# Patient Record
Sex: Female | Born: 1975 | Race: White | Hispanic: No | Marital: Single | Smoking: Current every day smoker
Health system: Southern US, Community
[De-identification: ages and names within clinical notes are randomized; demographics above are authoritative.]

## PROBLEM LIST (undated history)

## (undated) DIAGNOSIS — Z982 Presence of cerebrospinal fluid drainage device: Secondary | ICD-10-CM

## (undated) DIAGNOSIS — G039 Meningitis, unspecified: Secondary | ICD-10-CM

## (undated) HISTORY — PX: CHOLECYSTECTOMY: SHX55

---

## 2000-03-30 ENCOUNTER — Ambulatory Visit (HOSPITAL_COMMUNITY): Admission: RE | Admit: 2000-03-30 | Discharge: 2000-03-30 | Payer: Self-pay | Admitting: Obstetrics and Gynecology

## 2000-03-30 ENCOUNTER — Encounter: Payer: Self-pay | Admitting: Obstetrics and Gynecology

## 2000-06-10 ENCOUNTER — Ambulatory Visit (HOSPITAL_COMMUNITY): Admission: RE | Admit: 2000-06-10 | Discharge: 2000-06-10 | Payer: Self-pay | Admitting: Obstetrics and Gynecology

## 2000-07-11 ENCOUNTER — Encounter: Payer: Self-pay | Admitting: Obstetrics and Gynecology

## 2000-07-11 ENCOUNTER — Ambulatory Visit (HOSPITAL_COMMUNITY): Admission: RE | Admit: 2000-07-11 | Discharge: 2000-07-11 | Payer: Self-pay | Admitting: Obstetrics and Gynecology

## 2000-07-13 ENCOUNTER — Inpatient Hospital Stay (HOSPITAL_COMMUNITY): Admission: AD | Admit: 2000-07-13 | Discharge: 2000-07-13 | Payer: Self-pay | Admitting: Obstetrics and Gynecology

## 2000-07-15 ENCOUNTER — Encounter (HOSPITAL_COMMUNITY): Admission: RE | Admit: 2000-07-15 | Discharge: 2000-08-14 | Payer: Self-pay | Admitting: Obstetrics & Gynecology

## 2000-08-29 ENCOUNTER — Encounter (HOSPITAL_COMMUNITY): Admission: RE | Admit: 2000-08-29 | Discharge: 2000-09-01 | Payer: Self-pay | Admitting: Obstetrics and Gynecology

## 2000-08-31 ENCOUNTER — Inpatient Hospital Stay (HOSPITAL_COMMUNITY): Admission: AD | Admit: 2000-08-31 | Discharge: 2000-09-03 | Payer: Self-pay | Admitting: Obstetrics and Gynecology

## 2000-10-12 ENCOUNTER — Other Ambulatory Visit: Admission: RE | Admit: 2000-10-12 | Discharge: 2000-10-12 | Payer: Self-pay | Admitting: Obstetrics and Gynecology

## 2000-11-23 ENCOUNTER — Encounter (INDEPENDENT_AMBULATORY_CARE_PROVIDER_SITE_OTHER): Payer: Self-pay | Admitting: *Deleted

## 2000-11-24 ENCOUNTER — Encounter: Payer: Self-pay | Admitting: Emergency Medicine

## 2000-11-24 ENCOUNTER — Encounter: Payer: Self-pay | Admitting: General Surgery

## 2000-11-24 ENCOUNTER — Inpatient Hospital Stay (HOSPITAL_COMMUNITY): Admission: EM | Admit: 2000-11-24 | Discharge: 2000-11-27 | Payer: Self-pay | Admitting: Emergency Medicine

## 2001-09-04 ENCOUNTER — Encounter: Payer: Self-pay | Admitting: Emergency Medicine

## 2001-09-05 ENCOUNTER — Inpatient Hospital Stay (HOSPITAL_COMMUNITY): Admission: EM | Admit: 2001-09-05 | Discharge: 2001-09-09 | Payer: Self-pay | Admitting: Emergency Medicine

## 2001-09-06 ENCOUNTER — Encounter: Payer: Self-pay | Admitting: Internal Medicine

## 2001-09-09 ENCOUNTER — Encounter: Payer: Self-pay | Admitting: Infectious Diseases

## 2002-02-14 ENCOUNTER — Emergency Department (HOSPITAL_COMMUNITY): Admission: EM | Admit: 2002-02-14 | Discharge: 2002-02-14 | Payer: Self-pay | Admitting: Emergency Medicine

## 2002-02-15 ENCOUNTER — Encounter: Payer: Self-pay | Admitting: Emergency Medicine

## 2002-03-17 ENCOUNTER — Emergency Department (HOSPITAL_COMMUNITY): Admission: EM | Admit: 2002-03-17 | Discharge: 2002-03-17 | Payer: Self-pay | Admitting: Emergency Medicine

## 2002-03-20 ENCOUNTER — Other Ambulatory Visit: Admission: RE | Admit: 2002-03-20 | Discharge: 2002-03-20 | Payer: Self-pay | Admitting: Obstetrics and Gynecology

## 2003-02-16 ENCOUNTER — Emergency Department (HOSPITAL_COMMUNITY): Admission: EM | Admit: 2003-02-16 | Discharge: 2003-02-16 | Payer: Self-pay | Admitting: Emergency Medicine

## 2003-03-20 ENCOUNTER — Emergency Department (HOSPITAL_COMMUNITY): Admission: EM | Admit: 2003-03-20 | Discharge: 2003-03-20 | Payer: Self-pay | Admitting: Emergency Medicine

## 2003-03-25 ENCOUNTER — Emergency Department (HOSPITAL_COMMUNITY): Admission: EM | Admit: 2003-03-25 | Discharge: 2003-03-25 | Payer: Self-pay | Admitting: Emergency Medicine

## 2003-04-02 ENCOUNTER — Emergency Department (HOSPITAL_COMMUNITY): Admission: EM | Admit: 2003-04-02 | Discharge: 2003-04-02 | Payer: Self-pay | Admitting: Emergency Medicine

## 2003-06-08 ENCOUNTER — Emergency Department (HOSPITAL_COMMUNITY): Admission: EM | Admit: 2003-06-08 | Discharge: 2003-06-08 | Payer: Self-pay | Admitting: Emergency Medicine

## 2004-05-05 ENCOUNTER — Other Ambulatory Visit: Admission: RE | Admit: 2004-05-05 | Discharge: 2004-05-05 | Payer: Self-pay | Admitting: Obstetrics and Gynecology

## 2004-08-01 ENCOUNTER — Emergency Department (HOSPITAL_COMMUNITY): Admission: EM | Admit: 2004-08-01 | Discharge: 2004-08-01 | Payer: Self-pay | Admitting: Emergency Medicine

## 2004-08-15 ENCOUNTER — Emergency Department (HOSPITAL_COMMUNITY): Admission: EM | Admit: 2004-08-15 | Discharge: 2004-08-15 | Payer: Self-pay | Admitting: *Deleted

## 2004-08-22 ENCOUNTER — Emergency Department (HOSPITAL_COMMUNITY): Admission: EM | Admit: 2004-08-22 | Discharge: 2004-08-22 | Payer: Self-pay | Admitting: Emergency Medicine

## 2004-08-27 ENCOUNTER — Inpatient Hospital Stay (HOSPITAL_COMMUNITY): Admission: EM | Admit: 2004-08-27 | Discharge: 2004-08-31 | Payer: Self-pay | Admitting: Emergency Medicine

## 2004-08-30 ENCOUNTER — Encounter: Payer: Self-pay | Admitting: *Deleted

## 2004-09-06 ENCOUNTER — Emergency Department (HOSPITAL_COMMUNITY): Admission: EM | Admit: 2004-09-06 | Discharge: 2004-09-06 | Payer: Self-pay | Admitting: Emergency Medicine

## 2004-09-12 ENCOUNTER — Encounter: Admission: RE | Admit: 2004-09-12 | Discharge: 2004-09-12 | Payer: Self-pay | Admitting: Internal Medicine

## 2004-10-12 ENCOUNTER — Emergency Department (HOSPITAL_COMMUNITY): Admission: EM | Admit: 2004-10-12 | Discharge: 2004-10-12 | Payer: Self-pay | Admitting: Emergency Medicine

## 2004-11-03 ENCOUNTER — Emergency Department (HOSPITAL_COMMUNITY): Admission: EM | Admit: 2004-11-03 | Discharge: 2004-11-03 | Payer: Self-pay | Admitting: Emergency Medicine

## 2004-11-06 ENCOUNTER — Emergency Department (HOSPITAL_COMMUNITY): Admission: EM | Admit: 2004-11-06 | Discharge: 2004-11-06 | Payer: Self-pay | Admitting: Emergency Medicine

## 2004-11-15 ENCOUNTER — Emergency Department (HOSPITAL_COMMUNITY): Admission: EM | Admit: 2004-11-15 | Discharge: 2004-11-15 | Payer: Self-pay | Admitting: *Deleted

## 2004-11-19 ENCOUNTER — Emergency Department (HOSPITAL_COMMUNITY): Admission: EM | Admit: 2004-11-19 | Discharge: 2004-11-19 | Payer: Self-pay | Admitting: Emergency Medicine

## 2004-11-22 ENCOUNTER — Emergency Department (HOSPITAL_COMMUNITY): Admission: EM | Admit: 2004-11-22 | Discharge: 2004-11-22 | Payer: Self-pay | Admitting: Emergency Medicine

## 2004-11-27 ENCOUNTER — Emergency Department (HOSPITAL_COMMUNITY): Admission: EM | Admit: 2004-11-27 | Discharge: 2004-11-27 | Payer: Self-pay | Admitting: Emergency Medicine

## 2005-01-10 ENCOUNTER — Emergency Department (HOSPITAL_COMMUNITY): Admission: EM | Admit: 2005-01-10 | Discharge: 2005-01-10 | Payer: Self-pay | Admitting: Emergency Medicine

## 2005-01-18 ENCOUNTER — Ambulatory Visit: Payer: Self-pay | Admitting: Internal Medicine

## 2005-02-21 ENCOUNTER — Emergency Department (HOSPITAL_COMMUNITY): Admission: EM | Admit: 2005-02-21 | Discharge: 2005-02-21 | Payer: Self-pay | Admitting: Emergency Medicine

## 2005-03-11 ENCOUNTER — Ambulatory Visit: Payer: Self-pay | Admitting: Family Medicine

## 2005-03-17 ENCOUNTER — Emergency Department (HOSPITAL_COMMUNITY): Admission: EM | Admit: 2005-03-17 | Discharge: 2005-03-17 | Payer: Self-pay | Admitting: Emergency Medicine

## 2005-04-14 ENCOUNTER — Emergency Department (HOSPITAL_COMMUNITY): Admission: EM | Admit: 2005-04-14 | Discharge: 2005-04-14 | Payer: Self-pay | Admitting: Emergency Medicine

## 2005-05-15 ENCOUNTER — Emergency Department (HOSPITAL_COMMUNITY): Admission: EM | Admit: 2005-05-15 | Discharge: 2005-05-15 | Payer: Self-pay | Admitting: *Deleted

## 2005-05-29 ENCOUNTER — Emergency Department (HOSPITAL_COMMUNITY): Admission: EM | Admit: 2005-05-29 | Discharge: 2005-05-29 | Payer: Self-pay | Admitting: Emergency Medicine

## 2005-06-14 ENCOUNTER — Emergency Department (HOSPITAL_COMMUNITY): Admission: EM | Admit: 2005-06-14 | Discharge: 2005-06-14 | Payer: Self-pay | Admitting: Emergency Medicine

## 2005-06-28 ENCOUNTER — Other Ambulatory Visit: Admission: RE | Admit: 2005-06-28 | Discharge: 2005-06-28 | Payer: Self-pay | Admitting: Obstetrics and Gynecology

## 2005-07-06 ENCOUNTER — Emergency Department (HOSPITAL_COMMUNITY): Admission: EM | Admit: 2005-07-06 | Discharge: 2005-07-06 | Payer: Self-pay | Admitting: Emergency Medicine

## 2005-07-15 ENCOUNTER — Emergency Department (HOSPITAL_COMMUNITY): Admission: EM | Admit: 2005-07-15 | Discharge: 2005-07-15 | Payer: Self-pay | Admitting: Emergency Medicine

## 2005-07-29 ENCOUNTER — Emergency Department (HOSPITAL_COMMUNITY): Admission: EM | Admit: 2005-07-29 | Discharge: 2005-07-29 | Payer: Self-pay | Admitting: Emergency Medicine

## 2005-09-22 ENCOUNTER — Emergency Department (HOSPITAL_COMMUNITY): Admission: EM | Admit: 2005-09-22 | Discharge: 2005-09-22 | Payer: Self-pay | Admitting: Emergency Medicine

## 2005-11-11 ENCOUNTER — Emergency Department (HOSPITAL_COMMUNITY): Admission: EM | Admit: 2005-11-11 | Discharge: 2005-11-11 | Payer: Self-pay | Admitting: Emergency Medicine

## 2006-04-26 ENCOUNTER — Emergency Department (HOSPITAL_COMMUNITY): Admission: EM | Admit: 2006-04-26 | Discharge: 2006-04-26 | Payer: Self-pay | Admitting: Emergency Medicine

## 2006-05-01 ENCOUNTER — Emergency Department (HOSPITAL_COMMUNITY): Admission: EM | Admit: 2006-05-01 | Discharge: 2006-05-01 | Payer: Self-pay | Admitting: Emergency Medicine

## 2006-06-28 ENCOUNTER — Emergency Department (HOSPITAL_COMMUNITY): Admission: EM | Admit: 2006-06-28 | Discharge: 2006-06-28 | Payer: Self-pay | Admitting: Emergency Medicine

## 2006-07-15 ENCOUNTER — Emergency Department (HOSPITAL_COMMUNITY): Admission: EM | Admit: 2006-07-15 | Discharge: 2006-07-15 | Payer: Self-pay | Admitting: Emergency Medicine

## 2006-07-17 ENCOUNTER — Emergency Department (HOSPITAL_COMMUNITY): Admission: EM | Admit: 2006-07-17 | Discharge: 2006-07-17 | Payer: Self-pay | Admitting: Emergency Medicine

## 2006-08-03 ENCOUNTER — Ambulatory Visit: Payer: Self-pay | Admitting: Internal Medicine

## 2006-08-16 ENCOUNTER — Ambulatory Visit (HOSPITAL_COMMUNITY): Admission: RE | Admit: 2006-08-16 | Discharge: 2006-08-16 | Payer: Self-pay | Admitting: Internal Medicine

## 2007-05-01 ENCOUNTER — Emergency Department (HOSPITAL_COMMUNITY): Admission: EM | Admit: 2007-05-01 | Discharge: 2007-05-01 | Payer: Self-pay | Admitting: Emergency Medicine

## 2007-09-15 ENCOUNTER — Emergency Department (HOSPITAL_COMMUNITY): Admission: EM | Admit: 2007-09-15 | Discharge: 2007-09-15 | Payer: Self-pay | Admitting: Emergency Medicine

## 2007-09-27 ENCOUNTER — Emergency Department (HOSPITAL_COMMUNITY): Admission: EM | Admit: 2007-09-27 | Discharge: 2007-09-27 | Payer: Self-pay | Admitting: Emergency Medicine

## 2007-11-12 ENCOUNTER — Emergency Department (HOSPITAL_COMMUNITY): Admission: EM | Admit: 2007-11-12 | Discharge: 2007-11-12 | Payer: Self-pay | Admitting: Emergency Medicine

## 2007-12-31 ENCOUNTER — Emergency Department (HOSPITAL_COMMUNITY): Admission: EM | Admit: 2007-12-31 | Discharge: 2007-12-31 | Payer: Self-pay | Admitting: Emergency Medicine

## 2008-07-08 ENCOUNTER — Emergency Department (HOSPITAL_COMMUNITY): Admission: EM | Admit: 2008-07-08 | Discharge: 2008-07-08 | Payer: Self-pay | Admitting: Family Medicine

## 2008-10-09 ENCOUNTER — Emergency Department (HOSPITAL_COMMUNITY): Admission: EM | Admit: 2008-10-09 | Discharge: 2008-10-09 | Payer: Self-pay | Admitting: Emergency Medicine

## 2009-05-21 ENCOUNTER — Emergency Department (HOSPITAL_COMMUNITY): Admission: EM | Admit: 2009-05-21 | Discharge: 2009-05-21 | Payer: Self-pay | Admitting: Emergency Medicine

## 2009-05-24 ENCOUNTER — Emergency Department (HOSPITAL_COMMUNITY): Admission: EM | Admit: 2009-05-24 | Discharge: 2009-05-24 | Payer: Self-pay | Admitting: Emergency Medicine

## 2009-05-28 ENCOUNTER — Emergency Department (HOSPITAL_COMMUNITY): Admission: EM | Admit: 2009-05-28 | Discharge: 2009-05-29 | Payer: Self-pay | Admitting: Emergency Medicine

## 2010-02-27 ENCOUNTER — Emergency Department (HOSPITAL_COMMUNITY)
Admission: EM | Admit: 2010-02-27 | Discharge: 2010-02-27 | Payer: Self-pay | Source: Home / Self Care | Admitting: Emergency Medicine

## 2010-03-31 ENCOUNTER — Emergency Department (HOSPITAL_COMMUNITY)
Admission: EM | Admit: 2010-03-31 | Discharge: 2010-03-31 | Payer: Self-pay | Source: Home / Self Care | Admitting: Emergency Medicine

## 2010-04-02 ENCOUNTER — Emergency Department (HOSPITAL_COMMUNITY)
Admission: EM | Admit: 2010-04-02 | Discharge: 2010-04-02 | Payer: Self-pay | Source: Home / Self Care | Admitting: Family Medicine

## 2010-05-18 LAB — POCT I-STAT, CHEM 8
BUN: 8 mg/dL (ref 6–23)
Chloride: 108 mEq/L (ref 96–112)
HCT: 44 % (ref 36.0–46.0)
Hemoglobin: 15 g/dL (ref 12.0–15.0)
Potassium: 3.9 mEq/L (ref 3.5–5.1)

## 2010-05-18 LAB — D-DIMER, QUANTITATIVE: D-Dimer, Quant: <0.22 ug/mL-FEU (ref 0.00–0.48)

## 2010-05-29 LAB — COMPREHENSIVE METABOLIC PANEL
AST: 13 U/L (ref 0–37)
Albumin: 3.3 g/dL — ABNORMAL LOW (ref 3.5–5.2)
Alkaline Phosphatase: 55 U/L (ref 39–117)
Chloride: 108 mEq/L (ref 96–112)
Creatinine, Ser: 0.73 mg/dL (ref 0.4–1.2)
GFR calc Af Amer: >60 mL/min (ref >60)
Potassium: 3.6 mEq/L (ref 3.5–5.1)
Total Bilirubin: 0.3 mg/dL (ref 0.3–1.2)

## 2010-05-29 LAB — GC/CHLAMYDIA PROBE AMP, GENITAL
Chlamydia, DNA Probe: NEGATIVE
GC Probe Amp, Genital: NEGATIVE

## 2010-05-29 LAB — URINALYSIS, ROUTINE W REFLEX MICROSCOPIC
Bilirubin Urine: NEGATIVE
Bilirubin Urine: NEGATIVE
Ketones, ur: NEGATIVE mg/dL
Ketones, ur: NEGATIVE mg/dL
Nitrite: NEGATIVE
Nitrite: NEGATIVE
Specific Gravity, Urine: 1.025 (ref 1.005–1.030)
Urobilinogen, UA: 0.2 mg/dL (ref 0.0–1.0)
Urobilinogen, UA: 0.2 mg/dL (ref 0.0–1.0)

## 2010-05-29 LAB — URINE MICROSCOPIC-ADD ON

## 2010-05-29 LAB — DIFFERENTIAL
Basophils Relative: 0 % (ref 0–1)
Eosinophils Absolute: 0.2 10*3/uL (ref 0.0–0.7)
Lymphocytes Relative: 22 % (ref 12–46)

## 2010-05-29 LAB — CBC
Platelets: 198 10*3/uL (ref 150–400)
WBC: 11.3 10*3/uL — ABNORMAL HIGH (ref 4.0–10.5)

## 2010-05-29 LAB — POCT I-STAT, CHEM 8
Calcium, Ion: 1.14 mmol/L (ref 1.12–1.32)
Chloride: 106 mEq/L (ref 96–112)
HCT: 40 % (ref 36.0–46.0)
TCO2: 25 mmol/L (ref 0–100)

## 2010-05-29 LAB — URINE CULTURE
Colony Count: >=100000
Culture: NO GROWTH

## 2010-05-29 LAB — POCT PREGNANCY, URINE: Preg Test, Ur: NEGATIVE

## 2010-05-31 LAB — CBC
HCT: 39.8 % (ref 36.0–46.0)
MCV: 97.8 fL (ref 78.0–100.0)
Platelets: 228 10*3/uL (ref 150–400)
RDW: 12.9 % (ref 11.5–15.5)

## 2010-05-31 LAB — WET PREP, GENITAL
Trich, Wet Prep: NONE SEEN
Yeast Wet Prep HPF POC: NONE SEEN

## 2010-05-31 LAB — DIFFERENTIAL
Basophils Absolute: 0 10*3/uL (ref 0.0–0.1)
Eosinophils Absolute: 0.2 10*3/uL (ref 0.0–0.7)
Eosinophils Relative: 2 % (ref 0–5)
Neutrophils Relative %: 68 % (ref 43–77)

## 2010-05-31 LAB — LIPASE, BLOOD: Lipase: 28 U/L (ref 11–59)

## 2010-07-24 NOTE — H&P (Signed)
[place]. [facility]  Patient:    Leah Grimes, MOYD Visit Number: [id number] MRN: [id number]          Service Type: SUR Location: [id number] Attending Physician:  Brandy Hale Dictated by:   Angelia Mould. Derrell Lolling, M.D. Admit Date:  11/23/2000   CC:         Hillary Bow, M.D.   History and Physical  CHIEF COMPLAINT:  Abdominal pain.  HISTORY OF PRESENT ILLNESS:  This is a 35 year old white female who developed upper abdominal pain radiating to her back at 8:30 last night after supper. This became severe, but was not associated with nausea, vomiting, fever, chills, or diarrhea.  She came to the emergency room, and has undergone extensive evaluation.  The pain has gotten much better over the past two hours after she received a narcotic analgesic.  Workup shows gallstones, and I was called to see her for evaluation.  PAST MEDICAL HISTORY: 1. She has had one pregnancy and one delivery, and is now 3 months postpartum. 2. She had some type of "tags" removed from around her rectum in 1997. 3. Wisdom teeth have been removed.  CURRENT MEDICATIONS:  Birth control patch, and changes this weekly.  ALLERGIES:  No known drug allergies.  FAMILY HISTORY:  Mother and father living and well.  She has two sisters, one of which has asthma.  No familial diseases, otherwise noncontributory.  SOCIAL HISTORY:  The patient has one child.  She lives with her boyfriend here in [place].  She works as the International aid/development worker for [employer]  She smokes 1/2 pack of cigarettes per day.  Alcohol intake is minimal, drinking one drink per week.  REVIEW OF SYSTEMS:  All systems are reviewed and are noncontributory except as described above.  PHYSICAL EXAMINATION:  GENERAL:  A pleasant, somewhat overweight, young white female in minimal distress.  VITAL SIGNS:  Temperature 97.7, initial pulse was 125, respiratory rate 22, blood pressure 145/98.  HEENT:   Sclerae clear.  Extraocular movements intact.  Oropharynx clear without lesions.  NECK:  Supple, nontender, no mass, no adenopathy, no thyromegaly, no bruit.  LUNGS:  Clear to auscultation.  HEART:  Regular rate and rhythm, no murmur.  No CVA tenderness.  ABDOMEN:  Somewhat obese, soft, minimal subjective right upper quadrant tenderness.  No guarding, no mass.  Bowel sounds are present, hypoactive. There are no hernias noted.  She has stretch marks throughout the abdomen.  EXTREMITIES:  No edema, good pulses.  NEUROLOGIC:  Grossly within normal limits.  LABORATORY DATA:  Ultrasound shows multiple gallstones, some thickening of the gallbladder wall.  There is no free fluid, and the common bile duct measures 4.0 mm.  Hemoglobin 13.0, white blood cell count 15,200, lipase 37, amylase 65.  SGOT 74, SGPT 67, other liver function tests are normal.  Urinalysis is pending.  IMPRESSION: 1. Acute cholecystitis with cholelithiasis. 2. Three months postpartum.  PLAN:  The patient will be admitted to the hospital and started on IV antibiotics, and given analgesics as necessary.  We will plan to proceed with a laparoscopic cholecystectomy with cholangiogram today.  I have discussed the indications and details of the surgery with the patient and her mother.  Risks and complications have been outlined, including but not limited to, conversion to open laparotomy, bleeding, infection, bile duct injury, injury to adjacent organs, wound problems such as infection and hernia, and other unforeseen problems.  They seem to understand these issues well.  At  this time, all of their questions are answered.  She agrees to this plan. Dictated by:   Angelia Mould. Derrell Lolling, M.D. Attending Physician:  Brandy Hale DD:  11/24/00 TD:  11/24/00 Job: [id number]

## 2010-07-24 NOTE — Op Note (Signed)
[place]. [facility]  Patient:    Leah Grimes, Leah Grimes Visit Number: [id number] MRN: [id number]          Service Type: SUR Location: 5700 [id number] Attending Physician:  Brandy Hale Dictated by:   Angelia Mould. Derrell Lolling, M.D. Proc. Date: 11/24/00 Admit Date:  11/23/2000   CC:         [name], M.D.   Operative Report  PREOPERATIVE DIAGNOSIS:  Chronic and acute cholecystitis with cholelithiasis.  POSTOPERATIVE DIAGNOSIS:  Acute and chronic cholecystitis with cholelithiasis, choledocholithiasis.  OPERATION PERFORMED:  Laparoscopic cholecystectomy with intraoperative cholangiogram.  SURGEON:  Angelia Mould. Derrell Lolling, M.D.  ASSISTANT:  Jimmye Norman, M.D.  ANESTHESIA:  INDICATIONS FOR PROCEDURE:  The patient is a 35 year old white female who presented to the . [facility] Emergency Department with a 12-hour history of upper abdominal pain, back pain, but no vomiting.  She has not had any prior episodes.  On exam she was found to have fairly significant tenderness in the upper abdomen which improved after several hours. Ultrasound showed a thickened gallbladder and multiple gallstones.  The common bile duct was only 4.0 mm in diameter.  Blood work revealed an SGOT to be 74, SGPT 67, bilirubin normal, white blood cell count 15,000, amylase 65.  She was brought to the operating room this morning for cholecystectomy.  OPERATIVE FINDINGS:  The gallbladder was minimally inflamed, had a little bit of edema around the infundibulum but otherwise did not look obviously infected.  The gallbladder and the cystic duct contained numerous stones.  The cholangiogram showed normal intrahepatic and extrahepatic bile duct anatomy, but there appeared to be a filling defect in the distal common bile duct and on two injections, the contrast did not drain into the duodenum.  The liver appeared healthy.  The stomach, duodenum, small intestine, and large  intestine were grossly normal to inspection.  There was no ascites.  The peritoneal surfaces were normal.  DESCRIPTION OF PROCEDURE:  Following the induction of general endotracheal anesthesia, the patients abdomen was prepped and draped in sterile fashion. 0.25% Marcaine with epinephrine was used as a local infiltration anesthetic. A vertically oriented incision was made at the lower rim of the umbilicus. The fascia was incised in the midline and the abdominal cavity entered under direct vision.  A 10 mm Hasson trocar was inserted and secured with a pursestring suture of 0 Vicryl.  Pneumoperitoneum was created.  A video camera was inserted with visualization and with the findings as described above.  A 10 mm trocar was placed in the subxiphoid region and two 5 mm trocars placed in the right midabdomen.  The gallbladder was elevated and placed on traction. I noted that the patient had a very large hepatic artery which was tortuous and swung around anterior to the common bile duct before going back to the left and back up into the liver.  This was easily avoided, however.  We dissected out the cystic duct and cystic artery very carefully.  I got a significantly good length of the cystic duct.  I saw stones in the cystic duct and milked them back into the gallbladder.  A clip was placed on the cystic duct close to the gallbladder.  The cystic duct was opened and a cholangiogram catheter inserted after milking a couple of stones out of it.  Cholangiogram was obtained using the C-arm.  The cholangiogram showed a filling defect in the distal common bile duct and no flow into the  duodenum.  Otherwise the anatomy was normal.  We felt that ERCP would be the best approach for managing her common bile duct stones.  The cholangiogram catheter was removed.  The cystic duct was secured with multiple metal clips and divided.  The cystic artery was isolated and controlled with metal clips.  The  gallbladder was then dissected from its bed with electrocautery and removed through the umbilical port.  The operative field was copiously irrigated.  At the completion of the case there was no bleeding and no bile leak whatsoever.  The irrigation fluid was completely clear.  The trocars were removed under direct vision and there was no bleeding from the trocar sites.  The pneumoperitoneum was released.  The fascia at the umbilicus was closed with 0 Vicryl sutures.  The skin incisions were closed with subcuticular sutures of 4-0 Vicryl and Steri-Strips.  Clean bandages were placed.  The patient was taken to the recovery room in stable condition. Estimated blood loss was about 15 cc.  Complications were none.  Sponge, needle and instrument counts were correct.  I contacted Dr. [name] to evaluate the patient for possible endoscopic retrograde cholangiopancreatography during her hospital course. Dictated by:   Angelia Mould. Derrell Lolling, M.D. Attending Physician:  Brandy Hale DD:  11/24/00 TD:  11/24/00 Job: [id number]

## 2010-07-24 NOTE — H&P (Signed)
[facility]  Patient:    Leah Grimes, Leah Grimes Visit Number: [id number] MRN: [id number]          Service Type: MED Location: [phone] Attending Physician:  Jerl Santos Dictated by:   Renford Dills, M.D. Admit Date:  09/04/2001                           History and Physical  PROBLEM LIST: 1. Headache, neck pain.  Rule out meningitis. 2. Recent ear infection, bilateral, 1 week ago.  Treated with Trimox and    Cipro otic. 3. Status post cholecystectomy in September 2002.  CHIEF COMPLAINT:  Headache.  HISTORY OF PRESENT ILLNESS:  A 35 year old healthy female with above medical problems was in her usual of health until yesterday approximately 3 p.m. and had the onset of headache at the base of the neck, worse with movement, cough, unrelieved with medications at home i.e. NSAIDs.  The patient denies any similar history of headaches and of note has had recent ear infection approximately 1 week ago treated with antibiotics.  On the day of the headache the patient stated that the headache persisted all day despite taking Motrin at home and went to sleep with a headache and awoke with a headache.  She came to the ED for evaluation because of persistent headache, associated with some nausea and vomiting.  Denied any blood. Also had some chills and sweats but no documented fever at home.  Of note also recalls having sensitivity to the light.  The patient also has had a sick contact with a small child, her daughter, who has had an ear infection.  In the ED the patient was evaluated, had lipid panel performed at [facility], call for further evaluation.  PAST MEDICAL HISTORY:  Please see problem list.  MEDICATIONS:  The patient takes Ortho Tri-Cyclen birth control pills.  SOCIAL HISTORY:  Positive for tobacco of 5 to 6 cigarettes per day. Social alcohol.  No drugs.  Patient works at [employer].  PAST SURGICAL HISTORY:  Significant for  cholecystectomy in 2002.  ALLERGIES:  There are no known drug allergies.  REVIEW OF SYSTEMS:  As noted in history of present illness.  FAMILY HISTORY:  Mother and sister have asthma.  Father essentially healthy.  PHYSICAL EXAMINATION;  GENERAL:  The patient is alert and oriented x 3.  Moderate distress secondary to headache and neck pain.  VITAL SIGNS:  Temperature 98.1, blood pressure 116/79, pulse 102.  HEENT:  Pupils, equal, round, reactive to light and icteric.  Positive nuchal pain with movement.  Negative Babinski and negative Kernig.  There are no oral lesions.  Right tympanic membranes are occluded with wax on the left. Sternal canal is moist.  Tympanic bulge with serous fluid behind.  CHEST:  Clear to auscultation bilaterally.  CARDIOVASCULAR:  Regular S1, S2.  No S3.  ABDOMEN:  Positive bowel sounds.  Nontender.  No hepatosplenomegaly.  EXTREMITIES:  No clubbing, cyanosis, or edema.  SKIN:  No rash.  RECTAL:  Deferred.  NEUROLOGIC:  Cranial nerves II-XII intact.  There is a positive photophobia. 5/5 motor and no cerebellar deficit.  Gait intact.  LABORATORY DATA:  CBC:  White count 13.5, hemoglobin 13.7, platelet 242.  CSF 21.  Total protein of 51, glucose 51, white blood cell count 155, 30 red blood cells, 90% lymphs, 8% neutrophils.  Gram stain moderate WBC, no organisms, ______, 177 white blood cells, and 10  red blood cells, 87% lymphs. Urinalysis is negative.  BMET essentially normal.  ______ to within normal range.  CT of the head negative.  ASSESSMENT AND PLAN: 1. Rule out meningitis in a patient with headache, photophobia and abnormal    CSF showing elevated protein and elevated WBC predominantly less.  This    most likely represents a viral meningitis but will treat empirically for    bacterial until cultures are obtained.  Start antibiotics in the form of    vancomycin and Rocephin.  Will pan culture the patient.  Will obtain ID    consult if course  is atypical.  Also check CSF for HSV via PCR. 2. Tobacco use.  Recommend stop. 3. Partially treated ear infection. The patient should be adequately covered    with the above antibiotics.  Will continue Cipro otic a.c.  I will make further recommendations after review of the above studies.  Thank you in advance. Dictated by:   Renford Dills, M.D. Attending Physician:  Jerl Santos DD:  09/05/01 TD:  09/06/01 Job: [id number]

## 2010-07-24 NOTE — Discharge Summary (Signed)
NAME:  Leah Grimes, Leah Grimes                      ACCOUNT NO.:  [id number]   MEDICAL RECORD NO.:  [id number]                   PATIENT TYPE:   LOCATION:                                       FACILITY:   PHYSICIAN:  Lilla Shook, M.D.            DATE OF BIRTH:   DATE OF ADMISSION:  09/05/2001  DATE OF DISCHARGE:  09/09/2001                                 DISCHARGE SUMMARY   DISCHARGE DIAGNOSES:  1. 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  2. Tobacco abuse.  3. Status post cholecystectomy September 2002.   CHIEF COMPLAINT:  Headache.   HISTORY OF PRESENT ILLNESS:  This is a 35 year old woman who, overall, was  healthy but was evaluated for the onset of a headache at the base of her  neck which was worse with movement or cough and unrelieved with medications  at home.  She was taking medications for the headache which persisted  despite these.  She has had some associated nausea and vomiting but with no  fevers or chills.  She did have some sensitivity to light.  She has a young  child who had an ear infection prior to that, as did she, and was seen for  this about a week prior.  She was on Trimox and Cipro ear drops.   PHYSICAL EXAMINATION:  Significant findings on admission: She was afebrile  with blood pressure 160/79.  There was positive nuchal pain with movement  but negative Babinski and Kernig signs.  Her tympanic membrane was occluded  with wax.  Tympanic bulge and serous fluid were noted.   LABORATORY DATA:  White blood cell count 13.5, hemoglobin 13.7.  CSF showed  a total protein 51, 90% lymphocytes.  Gram's stain was moderate white blood  cells, but no organisms.  Urinalysis was negative.  BMET: Essentially  normal.  CT of the head was negative.   HOSPITAL COURSE:  The patient was admitted with a presumed diagnosis of  meningitis of unknown cause.  She was placed on empiric antibiotics, that  being vancomycin and Rocephin.  Cultures were obtained.  Also, the  next day  and added, HSV and PPR to her CSF fluid studies.  She had pain control  initiated with Percocet and Phenergan for her nausea.  She was started on a  nicotine patch but there was a lot of difficulty with her leaving the floor  to go and smoke.  She did not improve after two days of antibiotics and was  sent for repeat lumbar puncture, which was again notable for lymphocytosis,  but also had atypical lymphs.  Her HSV PPR came back positive for herpes  simplex and she was started on acyclovir.  An infectious disease consult had  been obtained prior to that secondary to her persistent mild symptoms  despite antibiotic therapy.   CONDITION ON DISCHARGE:  Discharge condition: Stable.   DISCHARGE MEDICATIONS:  1. Valacyclovir 1 g b.i.d. for a  total of seven days.  2. Darvocet-N 100 p.r.n. headache, #21.  3. Cipro ear drops to each ear twice a day with the last dose being 7/8 at     10 p.m.    DISCHARGE INSTRUCTIONS:  She was to remain at home for approximately a week  and then resume her normal activities when feeling better.  She was to call  the office for an appointment in one week if need.  She was to return to the  emergency room if her headaches returned.                                                Lilla Shook, M.D.    SEJ/MEDQ  D:  11/15/2001  T:  11/15/2001  Job:  [id number] [id number]

## 2010-07-24 NOTE — Discharge Summary (Signed)
[facility]  Patient:    Leah Grimes, Leah Grimes                    MRN: [id number] Adm. Date:  [id number] Disc. Date: [id number] Attending:  Oliver Pila                           Discharge Summary  HISTORY OF PRESENT ILLNESS:   This a 35 year old, white, single female, para 0-0-2-0, gravida 3.  Last period November 22, 1999.  [facility] August 28, 2000, by dates and August 29, 2000, by ultrasound.  Admitted with rupture of membranes.  Blood group and type A- with a negative antibody.  VDRL negative. Rubella immune.  Hepatitis B surface antigen negative.  HIV negative.  GC and chlamydia negative.  Triple screen normal.  One-hour glucola 110.  Group B streptococcus negative.  Vaginal ultrasound on January 26, 2000, with crown-rump length of 2.1 cm, 8 weeks 6 days, and [facility] August 29, 2000.  Repeat ultrasound on March 30, 2000, showed average gestational age [redacted] weeks 2 days and [facility] August 29, 2000.  The patient had an essentially uncomplicated prenatal course.  At approximately 3:15 a.m. on the day of admission, the patient began contracting.  ALLERGIES:                    No known allergies.  PAST MEDICAL HISTORY:         D&C in 1998.  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 Illnesses:  Gonorrhea in 1997.  SOCIAL HISTORY:               Alcohol, tobacco, and drugs negative, except five or six to ten cigarettes per day.  FAMILY HISTORY:               Maternal first cousin with leukemia.  OBSTETRICAL HISTORY:          Early abortions in 1998 x 2.  PHYSICAL EXAMINATION ON ADMISSION:                    Normal vital signs.  ABDOMEN:                      Soft.  Fundal height 38 cm on August 29, 2000.  PELVIC:                       The cervix was fingertip, 60-70% effaced, and vertex at a -1 by the maternity admission unit nurse.  Positive fern by the maternity admission unit nurse practitioner.  IMPRESSIONS:                  1. Intrauterine pregnancy at 40+ weeks.                           2. Premature rupture of membranes.  HOSPITAL COURSE:              The patient was admitted to the hospital.  She was begun on Pitocin.  By 1 p.m., she was getting uncomfortable.  By 5:30 p.m. and her cervix was 4-5 cm, 90%.  An intrauterine pressure catheter was placed by Zenaida Niece, M.D.  By 7:50 p.m., the cervix was 6 cm, completely effaced, and vertex at a 0 station.  By 10:40 p.m., the cervix was fully dilated and vertex at  a +2 station.  The patient pushed well.  She had a spontaneous vaginal delivery of a living female infant, 7 pounds 4 ounces, Apgars of 9 at one minute and 9 at five minutes by Zenaida Niece, M.D., over a second degree laceration.  The placenta was spontaneous and intact.  A second degree laceration was repaired with 3-0 Vicryl.  Right labial laceration repaired with 3-0 Vicryl for hemostasis.  Blood loss less than 500 cc.  Postpartum the patient did quite well and was discharged on the second postpartum day.  LABORATORY DATA:              Hemoglobin 12.4, hematocrit 35.7, white count 13,300, platelet count 230,000.  RPR nonreactive.  The baby was Rh-. The mother was not an Rh immune globulin candidate.  FINAL DIAGNOSES:              1. Intrauterine pregnancy at 40 weeks.                               2. Rupture of membranes.                               3. Spontaneous vaginal delivery.  OPERATIONS:                   1. Spontaneous delivery.                               2. Repair of second degree midline laceration.  FINAL CONDITION:              Improved.  DISCHARGE INSTRUCTIONS:       Our regular discharge instruction booklet.  The patient is uncertain whether her physician told her to return in two weeks or six weeks, so she is to call the office on September 05, 2000, for final instructions in that regard.  DISCHARGE MEDICATIONS:        She is given a prescription for Percocet 5/325 mg, #16, one every four to six hours as  needed for pain.  FOLLOW-UP:                    She will return as directed on September 05, 2000. DD:  09/03/00 TD:  09/03/00 Job: [id number]

## 2010-07-24 NOTE — H&P (Signed)
NAMEARYAN, Leah Grimes             ACCOUNT NO.:  [id number]   MEDICAL RECORD NO.:  [id number]          PATIENT TYPE:  INP   LOCATION:  0104                         FACILITY:  [facility]   PHYSICIAN:  Theone Stanley, MD   DATE OF BIRTH:  [birth year]   DATE OF ADMISSION:  08/27/2004  DATE OF DISCHARGE:                                HISTORY & PHYSICAL   HISTORY OF PRESENT ILLNESS:  This is a 35 year old female who is presenting  with complaints of back pain and headache.  The patient started to have back  pain on Tuesday night and subsequently started to have a very severe  headache, starting today.  She has had nausea, one episode of emesis, and  because of this and the fact that she had a previous history of meningitis,  she came into the hospital.  She had a history of viral meningitis three  years ago.  Patient does state she had a cold about one week ago.  She  denies any fevers or chills.   PAST MEDICAL HISTORY:  1.  IBS.  2.  Meningitis three years ago.   MEDICATIONS:  None.   ALLERGIES:  None.   FAMILY HISTORY:  Positive for cancer.  There has been leukemia, ovarian, in  the family.   SOCIAL HISTORY:  Patient is single.  Has a 70-year-old child.  Smokes one  pack per week for the past 10 years now.  No alcohol or illegal drug use.   REVIEW OF SYSTEMS:  Please see HPI, otherwise all systems were negative.   PHYSICAL EXAMINATION:  VITAL SIGNS:  Temperature, T max 100.5, on admission  98.2, blood pressure 136/96, pulse 115, respiratory rate 20, satting 100%.  HEENT:  Head is normocephalic and atraumatic.  Patient had evidence of  nuchal rigidity.  Pupils are equal and reactive.  Patient had photophobia.  Mucosa moist.  NECK:  Supple.  HEART:  Regular rate and rhythm.  No murmurs, rubs or gallops.  LUNGS:  Clear to auscultation bilaterally.  ABDOMEN:  Soft, nontender, nondistended.  EXTREMITIES:  No clubbing, cyanosis or edema.  NEUROLOGIC:  Patient was alert and oriented  x3.  Nonfocal.  GU:  Deferred.   LABS/RADIOLOGY:  White count 10, hemoglobin 13, hematocrit 38, platelets  225.  Sodium 138, potassium 4.1, chloride 108, bicarb 21, glucose 101, BUN  7, creatinine 0.7, calcium 9, total protein 6.8, albumin 3.9, AST 14, ALT  14, alkaline phosphatase 38, total protein 0.8.   Patient had an LP performed by the emergency physician, which showed glucose  in the normal range at 51, protein slightly elevated at 64.  Tube #1 was  clear, RBCs at 18, WBCs at 391, percent neutrophils 1%, lymphocytes 96%,  monocytes 3%, eos 0.  Tube 4 was colorless, RBCs at 23, WBCs 403,  neutrophils 4%, lymphocytes 87%, monocytes 9%.   ASSESSMENT/PLAN:  A 35 year old female presenting with a headache.   Problem 1:  Headache secondary to what appears to be viral meningitis.  I  discussed the case with Infectious Disease because this is unusual for  someone to have viral meningitis  twice.  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  Because of this, I will start her on Valtrex, send her CSF fluid for HSV  PCR, per recommendations of Infectious Disease.  Give her Toradol for pain.  Provide supportive care while she is in the hospital.       AEJ/MEDQ  D:  08/27/2004  T:  08/27/2004  Job:  [id number]

## 2010-07-24 NOTE — Discharge Summary (Signed)
NAME:  Leah Grimes, Leah Grimes                       ACCOUNT NO.:  [id number]   MEDICAL RECORD NO.:  [id number]                   PATIENT TYPE:  INP   LOCATION:                                       FACILITY:  [facility]   PHYSICIAN:  Lilla Shook, M.D.            DATE OF BIRTH:  1975-06-10   DATE OF ADMISSION:  09/05/2001  DATE OF DISCHARGE:  09/09/2001                                 DISCHARGE SUMMARY   DISCHARGE DIAGNOSES:  1. 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  2. Tobacco abuse.  3. Status post cholecystectomy September 2002.   CHIEF COMPLAINT:  Headache.   HISTORY OF PRESENT ILLNESS:  This is a 35 year old woman who, overall, was  healthy but was evaluated for the onset of a headache at the base of her  neck which was worse with movement or cough and unrelieved with medications  at home.  She was taking medications for the headache which persisted  despite these.  She has had some associated nausea and vomiting but with no  fevers or chills.  She did have some sensitivity to light.  She has a young  child who had an ear infection prior to that, as did she, and was seen for  this about a week prior.  She was on Trimox and Cipro ear drops.   PHYSICAL EXAMINATION:  Significant findings on admission: She was afebrile  with blood pressure 160/79.  There was positive nuchal pain with movement  but negative Babinski and Kernig signs.  Her tympanic membrane was occluded  with wax.  Tympanic bulge and serous fluid were noted.   LABORATORY DATA:  White blood cell count 13.5, hemoglobin 13.7.  CSF showed  a total protein 51, 90% lymphocytes.  Gram's stain was moderate white blood  cells, but no organisms.  Urinalysis was negative.  BMET: Essentially  normal.  CT of the head was negative.   HOSPITAL COURSE:  The patient was admitted with a presumed diagnosis of  meningitis of unknown cause.  She was placed on empiric antibiotics, that  being vancomycin and Rocephin.  Cultures were  obtained.  Also, the next day  and added, HSV and PPR to her CSF fluid studies.  She had pain control  initiated with Percocet and Phenergan for her nausea.  She was started on a  nicotine patch but there was a lot of difficulty with her leaving the floor  to go and smoke.  She did not improve after two days of antibiotics and was  sent for repeat lumbar puncture, which was again notable for lymphocytosis,  but also had atypical lymphs.  Her HSV PPR came back positive for herpes  simplex and she was started on acyclovir.  An infectious disease consult had  been obtained prior to that secondary to her persistent mild symptoms  despite antibiotic therapy.   CONDITION ON DISCHARGE:  Discharge condition: Stable.   DISCHARGE MEDICATIONS:  1. Valacyclovir 1 g b.i.d. for a total of seven days.  2. Darvocet-N 100 p.r.n. headache, #21.  3. Cipro ear drops to each ear twice a day with the last dose being 7/8 at     10 p.m.    DISCHARGE INSTRUCTIONS:  She was to remain at home for approximately a week  and then resume her normal activities when feeling better.  She was to call  the office for an appointment in one week if need.  She was to return to the  emergency room if her headaches returned.                                               Lilla Shook, M.D.    SEJ/MEDQ  D:  11/15/2001  T:  11/15/2001  Job:  [id number] [id number]

## 2010-07-24 NOTE — Discharge Summary (Signed)
Leah Grimes, Leah Grimes             ACCOUNT NO.:  [id number]   MEDICAL RECORD NO.:  [id number]          PATIENT TYPE:  INP   LOCATION:  6743                         FACILITY:  [facility]   PHYSICIAN:  Deirdre Peer. Polite, M.D. DATE OF BIRTH:  31-Jan-1976   DATE OF ADMISSION:  08/27/2004  DATE OF DISCHARGE:                                 DISCHARGE SUMMARY   DISCHARGE DIAGNOSES:  1.  Aseptic meningitis, rule out herpes simplex virus.  Please note, PCR is      pending at the time of dictation.  Patient being treated empirically.  2.  Probable left otitis media.  3.  Headache secondary to #1, must also rule out component related to      migraine as patient did get relief secondary to Imitrex.   DISCHARGE MEDICATIONS:  1.  Valtrex 1 g q.12h. x7 days.  2.  Percocet one tablet q.6-8h. p.r.n.  3.  Imitrex 25 mg p.r.n. headache.  4.  Cipro Otic a.c. three drops to left ear two times a day.   DISPOSITION:  Patient is being discharged home in stable condition.  Currently patient does not have a primary care M.D. Asked to establish  herself with a primary care M.D.   STUDIES:  Patient had a CT of the head within normal limits.  MRI of the  brain within normal limits.   CSF culture negative.  HSV PCR pending at the time of this dictation.  Blood  cultures negative to date.  CBC within normal limits. BMET within normal  limits.  CSF tube #1 was colorless, 391 white blood cells, 18 red blood  cells, 96% lymphs, total protein 64, glucose 51.  Blood culture negative.  CSF Gram stain:  Rare wbc seen, no organism.  CSF culture:  No organisms  seen, no growth.   Chest x-ray normal.  CT of the headnormal.   HISTORY OF PRESENT ILLNESS:  A 35 year old female with history of HSV  meningitis presents to the ER with complaints of headache and meningeal  symptoms.  In the ED, the patient was evaulated.  Admission was deemed  necessary for further evaluation and treatment.  Please see dictated H&P for  further details.   PAST MEDICAL HISTORY:  As stated above.   MEDICATIONS ON ADMISSION:  None.   ALLERGIES:  None.   FAMILY HISTORY:  Noncontributory.   SOCIAL HISTORY:  Positive for tobacco, no drugs.   HOSPITAL COURSE:  PROBLEM #1 -  Patient was admitted to medicine floor bed.  While in the ED, patient underwent LP, results as stated above.  Case was  discussed with infectious disease by Dr. Jomarie Longs and there was concern for  Mollaret's meningitis.  Patient was about to be started on antiviral  treatment.  Initially patient was on oral which was ultimately changed to  IV.  Throughout this hospitalization, patient continued to have meningeal  symptoms that slowly improved.  All cultures to date have been negative.  Blood cultures as well as CSF culture, HSV PCR is pending.  Labs was called  today.  Patient was given a trial of Imitrex as  the patient has significant  family history of migraine headaches and patient did express some relief in  her symptoms, 24 hours after that patient had significant improvement in her  symptoms and states that she felt as if she is ready to be discharged home.  Patient clinically had meningitis based on CSF and physical examination.  Patient will be continued on Valtrex.  As the patient's symptomatology has  improved dramatically and infectious, i.e., bacterial meningitis has been  excluded based on cultures, I feel it is safe to discharge the patient to  home.  Patient will continue medications as outlined.  Again, it has been  stressed to the patient to establish herself with a primary M.D.   PROBLEM #2 -  PROBABLE OTITIS MEDIA:  Exam was negative.  No erythema or  fluid was noted on tympanic membrane, however, the patient did complain of  symptoms that were consistent with that.  Please note, when patient was  hospitalized in 2003, when she had HSV meningitis patient had similar  symptoms at that time.  Patient will continue Cipro otic as  outlined.   PROBLEM #3 -  CHRONIC BACK PAIN:  Probably related to muscular strain at  work as she has a job that requires her to do a significant amount of  lifting.  At this time, the patient is stable for discharge.       RDP/MEDQ  D:  08/31/2004  T:  08/31/2004  Job:  [id number]

## 2010-07-24 NOTE — Discharge Summary (Signed)
   NAME:  ENSLIE, SAHOTA                      ACCOUNT NO.:  [id number]   MEDICAL RECORD NO.:  [id number]                   PATIENT TYPE:   LOCATION:                                       FACILITY:   PHYSICIAN:  Lilla Shook, M.D.            DATE OF BIRTH:   DATE OF ADMISSION:  09/05/2001  DATE OF DISCHARGE:  09/09/2001                                 DISCHARGE SUMMARY                                               Lilla Shook, M.D.    SEJ/MEDQ  D:  11/16/2001  T:  11/15/2001  Job:  [id number]

## 2010-07-24 NOTE — Consult Note (Signed)
[place]. [facility]  Patient:    Leah Grimes, Leah Grimes Visit Number: [id number] MRN: [id number]          Service Type: SUR Location: 5700 5713 01 Attending Physician:  Brandy Hale Dictated by:   [name], M.D. Proc. Date: 11/24/00 Admit Date:  11/23/2000   CC:         Angelia Mould. Derrell Lolling, M.D.   Consultation Report  HISTORY OF PRESENT ILLNESS:  [name] is a 35 year old female whom I am asked to see for possible retained common bile duct stone, status post laparoscopic cholecystectomy. She presented to the emergency room last night complaining of moderately severe epigastric pain radiating into her back. She was discovered to have mildly abnormal liver function tests with an SGOT 74, SGPT 67. An ultrasonogram of her gallbladder revealed stones and a thickened wall with a common bile duct of 4 mm. This morning, early, she was taken to the OR for laparoscopic cholecystectomy. At the time of the surgery, intraoperative cholangiogram suggested a distal common bile duct stone without drainage of bile, and a meniscus was seen. Currently, the patient is postoperative. She has mild abdominal pain without vomiting. She has not noted any jaundice. She has not had fevers or chills.  PAST MEDICAL HISTORY:  Fairly unremarkable. She is three months postpartum.  FAMILY HISTORY:  Noncontributory.  SOCIAL HISTORY:  She is a smoker of about a pack a day and occasionally drinks alcohol.  REVIEW OF SYSTEMS:  GENERAL:  No weight loss or night sweats. ENDOCRINE:  No history of diabetes or thyroid problems. SKIN:  No rash or pruritus. EYES:  No icterus or change in vision. ENT:  No aphthous ulcers or chronic sore throat. RESPIRATORY:  No shortness of breath, cough, or wheezing. CARDIAC:  No chest pain, palpitations, or history of valvular heart disease. GI:  As above. GU: No dysuria or hematuria. The remainder of the review of systems is  negative.  PHYSICAL EXAMINATION:  GENERAL:  She is an overweight, adult female in minimal discomfort.  VITAL SIGNS:  Afebrile, blood pressure 121/50, pulse 95.  SKIN:  Normal.  HEENT:  Eyes are anicteric. Oropharynx reveals dry mucosa.  CHEST:  Clear.  CARDIAC:  Heart sounds regular rate and rhythm without murmurs, rubs, or gallops.  ABDOMEN:  Mildly distended postoperatively. The laparoscopic sites are dry. There is minimal generalized tenderness and hypoactive bowel sounds at present.  RECTAL:  Not performed.  EXTREMITIES:  Without clubbing, cyanosis, edema, or rash.  LABORATORY DATA:  Reveal a hemoglobin of 13, platelet count 205, white blood count 15.2. SGOT 74, SGPT 67, and albumin 3.4; amylase and lipase are normal.  IMPRESSION:  A 35 year old female status post laparoscopic cholecystectomy with presumed retained common bile duct stone.  PLAN:  I will check her laboratory tests this afternoon but suspect that we should proceed with ERCP, sphincterotomy, and stone removal if it is present. The above is discussed with the patient, and ERCP is reviewed in terms of technique, preparation, and risks of complications, including bleeding, perforation, and pancreatitis. She agrees to proceed, and arrangements have been made for the exam this afternoon. Further recommendations to follow the results of the above. Dictated by:   [name], M.D. Attending Physician:  Brandy Hale DD:  11/24/00 TD:  11/24/00 Job: [id number]

## 2010-07-24 NOTE — Discharge Summary (Signed)
[place]. [facility]  Patient:    Leah Grimes, Leah Grimes Visit Number: [id number] MRN: [id number]          Service Type: SUR Location: 5700 5713 01 Attending Physician:  Brandy Hale Dictated by:   Angelia Mould. Derrell Lolling, M.D. Admit Date:  11/23/2000 Discharge Date: 11/27/2000   CC:         Zenaida Niece, M.D.  [name], M.D.   Discharge Summary  FINAL DIAGNOSES: 1. Chronic cholecystitis with cholelithiasis. 2. Choledocholithiasis. 3. Three months postpartum.  OPERATIONS PERFORMED: 1. Laparoscopic cholecystectomy with intraoperative cholangiogram, date    November 24, 2000. 2. ERCP, date November 24, 2000.  HISTORY:  This is a 35 year old white female who presented with 24-hour history of upper abdominal pain which was severe.  She was not vomiting.  She denied fever.  She came to the emergency room and underwent an extensive evaluation which showed gallstones, and I was asked to see her.  PHYSICAL EXAMINATION:  She was afebrile.  Initial pulse 125, but that came back to normal very quickly.  Respiratory rate 22.  Blood pressure 145/98. HEENT - sclerae clear.  Lungs clear.  Heart regular rate and rhythm with no murmur.  Abdomen somewhat obese, soft, minimal subjective right upper quadrant tenderness.  No mass and no guarding.  Stretch marks throughout the abdomen.  ADMISSION LABORATORY DATA:  Ultrasound showed multiple gallstones and some thickening of the gallbladder, but no free fluid.  Common bile duct measured 4.0 mm.  Hemoglobin 13, white count 15,200.  Lipase 37.  Amylase 65.  SGOT 74, SGPT 67, and other liver function tests normal.  HOSPITAL COURSE:  On the day of admission, the patient was taken to the operating room and underwent laparoscopic cholecystectomy with intraoperative cholangiogram.  Cholangiogram showed a distal common bile duct stone and no drainage.  Postoperative, the patient was seen by Dr. [name].  He performed ERCP that evening and got some sludge out of the common bile duct after doing a sphincterotomy.  Pull through, it was clear.  The patient did fairly well thereafter.  On November 25, 2000, she had minimal pain.  Her abdomen was benign.  Lab work showed a lipase of 296. Total bilirubin of 0.4.  She was kept in the hospital and observed for two more days because of nausea and vomiting which did resolve.  On November 27, 2000, the patient was feeling well.  She was eating, out smoking, and her pain had completely resolved, and she was tolerating a diet. She was discharged home on November 27, 2000.  Followup will be in the office and was arranged. Dictated by:   Angelia Mould. Derrell Lolling, M.D. Attending Physician:  Brandy Hale DD:  12/08/00 TD:  12/09/00 Job: [phone]

## 2010-08-12 ENCOUNTER — Emergency Department (HOSPITAL_COMMUNITY)
Admission: EM | Admit: 2010-08-12 | Discharge: 2010-08-12 | Discharge status: Against Medical Advice | Payer: Self-pay | Attending: Emergency Medicine | Admitting: Emergency Medicine

## 2010-08-12 DIAGNOSIS — X58XXXA Exposure to other specified factors, initial encounter: Secondary | ICD-10-CM | POA: Insufficient documentation

## 2010-08-12 DIAGNOSIS — S61209A Unspecified open wound of unspecified finger without damage to nail, initial encounter: Secondary | ICD-10-CM | POA: Insufficient documentation

## 2010-10-05 ENCOUNTER — Emergency Department (HOSPITAL_COMMUNITY)
Admission: EM | Admit: 2010-10-05 | Discharge: 2010-10-05 | Disposition: A | Discharge status: Home / Self Care | Payer: Self-pay | Attending: Emergency Medicine | Admitting: Emergency Medicine

## 2010-10-05 DIAGNOSIS — K0889 Other specified disorders of teeth and supporting structures: Secondary | ICD-10-CM | POA: Insufficient documentation

## 2010-10-05 DIAGNOSIS — K089 Disorder of teeth and supporting structures, unspecified: Secondary | ICD-10-CM | POA: Insufficient documentation

## 2010-10-30 ENCOUNTER — Emergency Department (HOSPITAL_COMMUNITY)
Admission: EM | Admit: 2010-10-30 | Discharge: 2010-10-30 | Disposition: A | Discharge status: Home / Self Care | Payer: Self-pay | Attending: Emergency Medicine | Admitting: Emergency Medicine

## 2010-10-30 DIAGNOSIS — R599 Enlarged lymph nodes, unspecified: Secondary | ICD-10-CM | POA: Insufficient documentation

## 2010-10-30 DIAGNOSIS — J029 Acute pharyngitis, unspecified: Secondary | ICD-10-CM | POA: Insufficient documentation

## 2010-10-30 LAB — RAPID STREP SCREEN (MED CTR MEBANE ONLY): Streptococcus, Group A Screen (Direct): NEGATIVE

## 2010-10-30 LAB — MONONUCLEOSIS SCREEN: Mono Screen: NEGATIVE

## 2010-11-27 LAB — DIFFERENTIAL
Eosinophils Absolute: 0.1
Lymphs Abs: 0.5 — ABNORMAL LOW
Monocytes Absolute: 0.3
Monocytes Relative: 3
Neutrophils Relative %: 91 — ABNORMAL HIGH

## 2010-11-27 LAB — URINALYSIS, ROUTINE W REFLEX MICROSCOPIC
Glucose, UA: NEGATIVE
Ketones, ur: NEGATIVE
pH: 8

## 2010-11-27 LAB — COMPREHENSIVE METABOLIC PANEL
ALT: 15
AST: 14
Albumin: 3.7
Calcium: 8.7
GFR calc Af Amer: >60
Glucose, Bld: 92
Sodium: 142
Total Protein: 6.4

## 2010-11-27 LAB — CBC
MCHC: 34.9
Platelets: 208
RDW: 13.2

## 2010-11-27 LAB — POCT PREGNANCY, URINE: Preg Test, Ur: NEGATIVE

## 2010-12-04 LAB — POCT I-STAT, CHEM 8
Calcium, Ion: 1.17
Chloride: 103
HCT: 42
Hemoglobin: 14.3
Potassium: 3.8

## 2010-12-04 LAB — CSF CULTURE W GRAM STAIN: Culture: NO GROWTH

## 2010-12-04 LAB — CSF CELL COUNT WITH DIFFERENTIAL
RBC Count, CSF: 1 — ABNORMAL HIGH
Tube #: 2

## 2010-12-04 LAB — POCT PREGNANCY, URINE
Operator id: 29011
Preg Test, Ur: NEGATIVE

## 2010-12-04 LAB — GLUCOSE, CSF: Glucose, CSF: 54

## 2010-12-04 LAB — URINALYSIS, ROUTINE W REFLEX MICROSCOPIC
Nitrite: NEGATIVE
Specific Gravity, Urine: 1.017
pH: 7.5

## 2010-12-04 LAB — CBC
Hemoglobin: 13.3
Platelets: 204
RDW: 12.8

## 2010-12-04 LAB — DIFFERENTIAL
Basophils Absolute: 0
Lymphocytes Relative: 15
Neutro Abs: 8.7 — ABNORMAL HIGH

## 2010-12-07 LAB — DIFFERENTIAL
Basophils Absolute: 0.1
Basophils Relative: 1
Eosinophils Absolute: 0.1
Monocytes Relative: 6
Neutro Abs: 7.1
Neutrophils Relative %: 76

## 2010-12-07 LAB — CBC
MCHC: 34
Platelets: 187
RBC: 3.73 — ABNORMAL LOW

## 2010-12-09 LAB — HERPES SIMPLEX VIRUS CULTURE: Culture: DETECTED

## 2010-12-31 ENCOUNTER — Emergency Department (HOSPITAL_COMMUNITY): Payer: Self-pay

## 2010-12-31 ENCOUNTER — Emergency Department (HOSPITAL_COMMUNITY)
Admission: EM | Admit: 2010-12-31 | Discharge: 2010-12-31 | Disposition: A | Discharge status: Home / Self Care | Payer: Self-pay | Attending: Emergency Medicine | Admitting: Emergency Medicine

## 2010-12-31 DIAGNOSIS — Y998 Other external cause status: Secondary | ICD-10-CM | POA: Insufficient documentation

## 2010-12-31 DIAGNOSIS — W208XXA Other cause of strike by thrown, projected or falling object, initial encounter: Secondary | ICD-10-CM | POA: Insufficient documentation

## 2010-12-31 DIAGNOSIS — S92919A Unspecified fracture of unspecified toe(s), initial encounter for closed fracture: Secondary | ICD-10-CM | POA: Insufficient documentation

## 2011-01-07 ENCOUNTER — Emergency Department: Payer: Self-pay | Admitting: *Deleted

## 2011-01-11 ENCOUNTER — Emergency Department (INDEPENDENT_AMBULATORY_CARE_PROVIDER_SITE_OTHER): Payer: Self-pay

## 2011-01-11 ENCOUNTER — Emergency Department (INDEPENDENT_AMBULATORY_CARE_PROVIDER_SITE_OTHER)
Admission: EM | Admit: 2011-01-11 | Discharge: 2011-01-11 | Disposition: A | Discharge status: Home / Self Care | Payer: Self-pay | Source: Home / Self Care

## 2011-01-11 DIAGNOSIS — S90129A Contusion of unspecified lesser toe(s) without damage to nail, initial encounter: Secondary | ICD-10-CM

## 2011-01-11 DIAGNOSIS — B353 Tinea pedis: Secondary | ICD-10-CM

## 2011-01-11 MED ORDER — FLUCONAZOLE 150 MG PO TABS: 150.0000 mg | ORAL_TABLET | ORAL | Status: AC

## 2011-01-11 MED ORDER — IBUPROFEN 800 MG PO TABS: 800.0000 mg | ORAL_TABLET | Freq: Three times a day (TID) | ORAL | Status: AC

## 2011-01-11 NOTE — ED Notes (Signed)
Pt states she was at a friends house 1 week ago , and when she went to closet to get her coat, a 20 lb kettle bell from shelf and landed on her foot. C/o since then, she has had pain, swelling, "foot doesn't feel right"; when asked what she did to treat foot, she stated ice, elevate and motrin; denies having seen any MD for this issue. Prior records on this pt shows visit on 10-25 to [facility], dx of fx toe, Rx Lortab ;

## 2011-01-11 NOTE — ED Provider Notes (Signed)
History     CSN: [id number] Arrival date & time: 01/11/2011  9:13 AM   First MD Initiated Contact with Patient 01/11/11 1109      Chief Complaint  Patient presents with  . Foot Injury    (Consider location/radiation/quality/duration/timing/severity/associated sxs/prior treatment) Patient is a 35 y.o. female presenting with foot injury. The history is provided by the patient.  Foot Injury  Incident onset: about 1 week ago. Incident location: at a friend's house. The injury mechanism was a direct blow (had a workout bell falling from a shelf landing on her left foot). The pain is present in the left toes. The quality of the pain is described as throbbing. The pain is at a severity of 4/10. The pain is moderate. The pain has been constant since onset. Associated symptoms include inability to bear weight. Pertinent negatives include no numbness and no loss of sensation. She reports no foreign bodies present. The symptoms are aggravated by bearing weight and palpation. She has tried ice, elevation and NSAIDs for the symptoms.    History reviewed. No pertinent past medical history.  Past Surgical History  Procedure Date  . Cholecystectomy     Family History  Problem Relation Age of Onset  . Cancer Other   . Hypertension Other     History  Substance Use Topics  . Smoking status: Not on file  . Smokeless tobacco: Not on file  . Alcohol Use: No    OB History    Grav Para Term Preterm Abortions TAB SAB Ect Mult Living                  Review of Systems  Musculoskeletal:       Swelling  Skin: Negative for color change.       Peeling of foot skin and between toes No bruising or cysnosis  Neurological: Negative for weakness and numbness.    Allergies  Review of patient's allergies indicates no known allergies.  Home Medications   Current Outpatient Rx  Name Route Sig Dispense Refill  . FLUCONAZOLE 150 MG PO TABS Oral Take 1 tablet (150 mg total) by mouth once a week.  12 tablet no  . IBUPROFEN 800 MG PO TABS Oral Take 1 tablet (800 mg total) by mouth 3 (three) times daily. 21 tablet 0    BP 112/72  Pulse 83  Temp(Src) 98.3 F (36.8 C) (Oral)  Resp 16  Physical Exam  Constitutional: She is oriented to person, place, and time. She appears well-developed and well-nourished.  Musculoskeletal: She exhibits edema and tenderness.       2nd toe swelling at the shaft of proximal and medial phalange making difficult to bend the toe. No significant tenderness over the MPJ or at the metatarsal bones that appear well aligned with no signs of trauma  Neurological: She is alert and oriented to person, place, and time. She has normal reflexes.  Skin: Rash noted. No bruising and no laceration noted. No erythema.       ED Course  Procedures (including critical care time)  Labs Reviewed - No data to display Dg Foot Complete Left  01/11/2011  *RADIOLOGY REPORT*  Clinical Data: Heavy object fell on foot several days ago with persistent pain  LEFT FOOT - COMPLETE 3+ VIEW  Comparison: None.  Findings: No acute fracture is seen.  The small bony density adjacent to the distal aspect of the proximal phalanx of the left great toe is unchanged and may represent an avulsion fracture  fragment which is subacute.  Joint spaces appear normal.  IMPRESSION: No acute fracture.  No change in probable subacute avulsion fracture from the distal aspect of the proximal phalanx of the left great toe.  Original Report Authenticated By: Juline Patch, M.D.     1. Toe contusion   2. Tinea pedis       MDM          Gwenna Fuston Moreno-Coll 01/12/11 1326

## 2011-05-23 ENCOUNTER — Encounter (HOSPITAL_COMMUNITY): Payer: Self-pay

## 2011-05-23 ENCOUNTER — Emergency Department (HOSPITAL_COMMUNITY): Payer: Self-pay

## 2011-05-23 ENCOUNTER — Emergency Department (HOSPITAL_COMMUNITY)
Admission: EM | Admit: 2011-05-23 | Discharge: 2011-05-23 | Discharge status: Against Medical Advice | Payer: Self-pay | Attending: Emergency Medicine | Admitting: Emergency Medicine

## 2011-05-23 DIAGNOSIS — M79609 Pain in unspecified limb: Secondary | ICD-10-CM | POA: Insufficient documentation

## 2011-05-23 NOTE — ED Notes (Signed)
Patient also inquired about fast HR that has occurred in the past-reports that her HR WAS 150's last week. No complaints of that today

## 2011-05-25 ENCOUNTER — Emergency Department (HOSPITAL_COMMUNITY)
Admission: EM | Admit: 2011-05-25 | Discharge: 2011-05-25 | Disposition: A | Discharge status: Home / Self Care | Payer: Self-pay | Attending: Emergency Medicine | Admitting: Emergency Medicine

## 2011-05-25 ENCOUNTER — Encounter (HOSPITAL_COMMUNITY): Payer: Self-pay

## 2011-05-25 ENCOUNTER — Emergency Department (HOSPITAL_COMMUNITY): Payer: Self-pay

## 2011-05-25 DIAGNOSIS — IMO0002 Reserved for concepts with insufficient information to code with codable children: Secondary | ICD-10-CM | POA: Insufficient documentation

## 2011-05-25 DIAGNOSIS — M79609 Pain in unspecified limb: Secondary | ICD-10-CM | POA: Insufficient documentation

## 2011-05-25 DIAGNOSIS — M7989 Other specified soft tissue disorders: Secondary | ICD-10-CM | POA: Insufficient documentation

## 2011-05-25 DIAGNOSIS — R269 Unspecified abnormalities of gait and mobility: Secondary | ICD-10-CM | POA: Insufficient documentation

## 2011-05-25 DIAGNOSIS — M79673 Pain in unspecified foot: Secondary | ICD-10-CM

## 2011-05-25 MED ORDER — HYDROCODONE-ACETAMINOPHEN 5-325 MG PO TABS: 1.0000 | ORAL_TABLET | ORAL | Status: AC | PRN
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
## 2011-05-26 NOTE — ED Provider Notes (Signed)
Medical screening examination/treatment/procedure(s) were performed by non-physician practitioner and as supervising physician I was immediately available for consultation/collaboration.  Sujata Maines L Jayjay Littles, MD 05/26/11 0740 

## 2011-05-30 ENCOUNTER — Emergency Department: Payer: Self-pay | Admitting: Emergency Medicine

## 2011-08-17 ENCOUNTER — Encounter (HOSPITAL_COMMUNITY): Payer: Self-pay | Admitting: *Deleted

## 2011-08-17 ENCOUNTER — Emergency Department (HOSPITAL_COMMUNITY)
Admission: EM | Admit: 2011-08-17 | Discharge: 2011-08-17 | Disposition: A | Discharge status: Home / Self Care | Payer: No Typology Code available for payment source | Attending: Emergency Medicine | Admitting: Emergency Medicine

## 2011-08-17 DIAGNOSIS — R071 Chest pain on breathing: Secondary | ICD-10-CM | POA: Insufficient documentation

## 2011-08-17 DIAGNOSIS — M546 Pain in thoracic spine: Secondary | ICD-10-CM | POA: Insufficient documentation

## 2011-08-17 DIAGNOSIS — M542 Cervicalgia: Secondary | ICD-10-CM | POA: Insufficient documentation

## 2011-08-17 DIAGNOSIS — F17200 Nicotine dependence, unspecified, uncomplicated: Secondary | ICD-10-CM | POA: Insufficient documentation

## 2011-08-17 HISTORY — DX: Meningitis, unspecified: G03.9

## 2011-08-17 MED ORDER — HYDROCODONE-ACETAMINOPHEN 5-325 MG PO TABS: 1.0000 | ORAL_TABLET | Freq: Four times a day (QID) | ORAL | Status: AC | PRN

## 2011-08-17 MED ORDER — KETOROLAC TROMETHAMINE 60 MG/2ML IM SOLN
60.0000 mg | Freq: Once | INTRAMUSCULAR | Status: AC
  Administered 2011-08-17: 60 mg via INTRAMUSCULAR
  Filled 2011-08-17: qty 2

## 2011-08-17 MED ORDER — DIAZEPAM 5 MG PO TABS: 5.0000 mg | ORAL_TABLET | Freq: Three times a day (TID) | ORAL | Status: AC | PRN

## 2011-08-17 MED ORDER — IBUPROFEN 800 MG PO TABS: 800.0000 mg | ORAL_TABLET | Freq: Three times a day (TID) | ORAL | Status: AC | PRN

## 2011-08-17 NOTE — ED Provider Notes (Signed)
Medical screening examination/treatment/procedure(s) were performed by non-physician practitioner and as supervising physician I was immediately available for consultation/collaboration.   Lyanne Co, MD 08/17/11 1447
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
## 2012-01-05 ENCOUNTER — Emergency Department (HOSPITAL_COMMUNITY)
Admission: EM | Admit: 2012-01-05 | Discharge: 2012-01-05 | Disposition: A | Discharge status: Home / Self Care | Payer: No Typology Code available for payment source | Attending: Emergency Medicine | Admitting: Emergency Medicine

## 2012-01-05 ENCOUNTER — Encounter (HOSPITAL_COMMUNITY): Payer: Self-pay | Admitting: Emergency Medicine

## 2012-01-05 DIAGNOSIS — S139XXA Sprain of joints and ligaments of unspecified parts of neck, initial encounter: Secondary | ICD-10-CM | POA: Insufficient documentation

## 2012-01-05 DIAGNOSIS — Z8669 Personal history of other diseases of the nervous system and sense organs: Secondary | ICD-10-CM | POA: Insufficient documentation

## 2012-01-05 DIAGNOSIS — Y9389 Activity, other specified: Secondary | ICD-10-CM | POA: Insufficient documentation

## 2012-01-05 DIAGNOSIS — F17200 Nicotine dependence, unspecified, uncomplicated: Secondary | ICD-10-CM | POA: Insufficient documentation

## 2012-01-05 DIAGNOSIS — Y92410 Unspecified street and highway as the place of occurrence of the external cause: Secondary | ICD-10-CM | POA: Insufficient documentation

## 2012-01-05 DIAGNOSIS — S161XXA Strain of muscle, fascia and tendon at neck level, initial encounter: Secondary | ICD-10-CM

## 2012-01-05 MED ORDER — IBUPROFEN 800 MG PO TABS: 800.0000 mg | ORAL_TABLET | Freq: Three times a day (TID) | ORAL | Status: DC

## 2012-01-05 MED ORDER — ORPHENADRINE CITRATE ER 100 MG PO TB12: 100.0000 mg | ORAL_TABLET | Freq: Two times a day (BID) | ORAL | Status: DC

## 2012-01-05 MED ORDER — OXYCODONE-ACETAMINOPHEN 5-325 MG PO TABS: 1.0000 | ORAL_TABLET | Freq: Four times a day (QID) | ORAL | Status: DC | PRN
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
## 2012-01-28 ENCOUNTER — Emergency Department (HOSPITAL_COMMUNITY): Payer: No Typology Code available for payment source

## 2012-01-28 ENCOUNTER — Encounter (HOSPITAL_COMMUNITY): Payer: Self-pay | Admitting: Emergency Medicine

## 2012-01-28 ENCOUNTER — Emergency Department (HOSPITAL_COMMUNITY)
Admission: EM | Admit: 2012-01-28 | Discharge: 2012-01-28 | Disposition: A | Discharge status: Home / Self Care | Payer: No Typology Code available for payment source | Attending: Emergency Medicine | Admitting: Emergency Medicine

## 2012-01-28 DIAGNOSIS — IMO0001 Reserved for inherently not codable concepts without codable children: Secondary | ICD-10-CM | POA: Insufficient documentation

## 2012-01-28 DIAGNOSIS — M542 Cervicalgia: Secondary | ICD-10-CM | POA: Insufficient documentation

## 2012-01-28 DIAGNOSIS — Y9389 Activity, other specified: Secondary | ICD-10-CM | POA: Insufficient documentation

## 2012-01-28 DIAGNOSIS — M549 Dorsalgia, unspecified: Secondary | ICD-10-CM

## 2012-01-28 DIAGNOSIS — S161XXA Strain of muscle, fascia and tendon at neck level, initial encounter: Secondary | ICD-10-CM

## 2012-01-28 DIAGNOSIS — M539 Dorsopathy, unspecified: Secondary | ICD-10-CM | POA: Insufficient documentation

## 2012-01-28 DIAGNOSIS — S139XXA Sprain of joints and ligaments of unspecified parts of neck, initial encounter: Secondary | ICD-10-CM | POA: Insufficient documentation

## 2012-01-28 DIAGNOSIS — M546 Pain in thoracic spine: Secondary | ICD-10-CM | POA: Insufficient documentation

## 2012-01-28 DIAGNOSIS — Z8669 Personal history of other diseases of the nervous system and sense organs: Secondary | ICD-10-CM | POA: Insufficient documentation

## 2012-01-28 MED ORDER — OXYCODONE-ACETAMINOPHEN 5-325 MG PO TABS
1.0000 | ORAL_TABLET | Freq: Once | ORAL | Status: DC
  Filled 2012-01-28: qty 1

## 2012-01-28 MED ORDER — HYDROCODONE-ACETAMINOPHEN 5-500 MG PO TABS: 1.0000 | ORAL_TABLET | Freq: Four times a day (QID) | ORAL | Status: DC | PRN

## 2012-01-28 MED ORDER — CYCLOBENZAPRINE HCL 10 MG PO TABS: 10.0000 mg | ORAL_TABLET | Freq: Two times a day (BID) | ORAL | Status: DC | PRN
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
## 2012-01-28 NOTE — ED Notes (Signed)
Rx given x2 Pt ambulating independently w/ steady gait on d/c in no acute distress, A&Ox4. D/c instructions reviewed w/ pt - pt denies any further questions or concerns at present.   

## 2012-01-28 NOTE — ED Notes (Signed)
[id number]<AV> Expected date:<BR> Expected time:<BR> Means of arrival:<BR> Comments:<BR> Triage Randa Evens
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
## 2012-02-02 NOTE — ED Provider Notes (Signed)
Medical screening examination/treatment/procedure(s) were performed by non-physician practitioner and as supervising physician I was immediately available for consultation/collaboration. Laveyah Oriol, MD, FACEP   Evian Derringer L Zavier Canela, MD 02/02/12 1506 

## 2012-05-05 ENCOUNTER — Emergency Department (HOSPITAL_COMMUNITY)
Admission: EM | Admit: 2012-05-05 | Discharge: 2012-05-05 | Disposition: A | Discharge status: Home / Self Care | Payer: No Typology Code available for payment source | Attending: Emergency Medicine | Admitting: Emergency Medicine

## 2012-05-05 ENCOUNTER — Emergency Department (HOSPITAL_COMMUNITY): Payer: No Typology Code available for payment source

## 2012-05-05 ENCOUNTER — Encounter (HOSPITAL_COMMUNITY): Payer: Self-pay | Admitting: *Deleted

## 2012-05-05 DIAGNOSIS — F17200 Nicotine dependence, unspecified, uncomplicated: Secondary | ICD-10-CM | POA: Insufficient documentation

## 2012-05-05 DIAGNOSIS — S139XXA Sprain of joints and ligaments of unspecified parts of neck, initial encounter: Secondary | ICD-10-CM | POA: Insufficient documentation

## 2012-05-05 DIAGNOSIS — S161XXA Strain of muscle, fascia and tendon at neck level, initial encounter: Secondary | ICD-10-CM

## 2012-05-05 DIAGNOSIS — Y92410 Unspecified street and highway as the place of occurrence of the external cause: Secondary | ICD-10-CM | POA: Insufficient documentation

## 2012-05-05 DIAGNOSIS — Z8679 Personal history of other diseases of the circulatory system: Secondary | ICD-10-CM | POA: Insufficient documentation

## 2012-05-05 DIAGNOSIS — Y939 Activity, unspecified: Secondary | ICD-10-CM | POA: Insufficient documentation

## 2012-05-05 MED ORDER — HYDROCODONE-ACETAMINOPHEN 5-325 MG PO TABS: 1.0000 | ORAL_TABLET | Freq: Four times a day (QID) | ORAL | Status: DC | PRN

## 2012-05-05 MED ORDER — CYCLOBENZAPRINE HCL 10 MG PO TABS: 10.0000 mg | ORAL_TABLET | Freq: Three times a day (TID) | ORAL | Status: DC | PRN

## 2012-05-05 MED ORDER — HYDROMORPHONE HCL PF 1 MG/ML IJ SOLN
1.0000 mg | Freq: Once | INTRAMUSCULAR | Status: AC
  Administered 2012-05-05: 1 mg via INTRAMUSCULAR
  Filled 2012-05-05: qty 1
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
## 2012-11-13 ENCOUNTER — Encounter (HOSPITAL_COMMUNITY): Payer: Self-pay | Admitting: *Deleted

## 2012-11-13 ENCOUNTER — Emergency Department (HOSPITAL_COMMUNITY)
Admission: EM | Admit: 2012-11-13 | Discharge: 2012-11-13 | Disposition: A | Discharge status: Home / Self Care | Payer: Self-pay | Attending: Emergency Medicine | Admitting: Emergency Medicine

## 2012-11-13 DIAGNOSIS — Y9389 Activity, other specified: Secondary | ICD-10-CM | POA: Insufficient documentation

## 2012-11-13 DIAGNOSIS — S39012A Strain of muscle, fascia and tendon of lower back, initial encounter: Secondary | ICD-10-CM

## 2012-11-13 DIAGNOSIS — S0993XA Unspecified injury of face, initial encounter: Secondary | ICD-10-CM | POA: Insufficient documentation

## 2012-11-13 DIAGNOSIS — F17200 Nicotine dependence, unspecified, uncomplicated: Secondary | ICD-10-CM | POA: Insufficient documentation

## 2012-11-13 DIAGNOSIS — Y92410 Unspecified street and highway as the place of occurrence of the external cause: Secondary | ICD-10-CM | POA: Insufficient documentation

## 2012-11-13 DIAGNOSIS — S6990XA Unspecified injury of unspecified wrist, hand and finger(s), initial encounter: Secondary | ICD-10-CM | POA: Insufficient documentation

## 2012-11-13 DIAGNOSIS — S59909A Unspecified injury of unspecified elbow, initial encounter: Secondary | ICD-10-CM | POA: Insufficient documentation

## 2012-11-13 DIAGNOSIS — Z8661 Personal history of infections of the central nervous system: Secondary | ICD-10-CM | POA: Insufficient documentation

## 2012-11-13 DIAGNOSIS — S239XXA Sprain of unspecified parts of thorax, initial encounter: Secondary | ICD-10-CM | POA: Insufficient documentation

## 2012-11-13 DIAGNOSIS — S43499A Other sprain of unspecified shoulder joint, initial encounter: Secondary | ICD-10-CM | POA: Insufficient documentation

## 2012-11-13 DIAGNOSIS — S0990XA Unspecified injury of head, initial encounter: Secondary | ICD-10-CM | POA: Insufficient documentation

## 2012-11-13 DIAGNOSIS — S46819A Strain of other muscles, fascia and tendons at shoulder and upper arm level, unspecified arm, initial encounter: Secondary | ICD-10-CM

## 2012-11-13 MED ORDER — CYCLOBENZAPRINE HCL 10 MG PO TABS: 10.0000 mg | ORAL_TABLET | Freq: Two times a day (BID) | ORAL | Status: DC | PRN

## 2012-11-13 MED ORDER — HYDROCODONE-ACETAMINOPHEN 5-325 MG PO TABS: 1.0000 | ORAL_TABLET | ORAL | Status: DC | PRN

## 2012-11-13 NOTE — Progress Notes (Signed)
[facility] provided pt with a list of primary care resources and a [facility] application.  °
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
## 2013-11-27 ENCOUNTER — Encounter (HOSPITAL_COMMUNITY): Payer: Self-pay | Admitting: Emergency Medicine

## 2013-11-27 ENCOUNTER — Emergency Department (HOSPITAL_COMMUNITY)
Admission: EM | Admit: 2013-11-27 | Discharge: 2013-11-27 | Disposition: A | Discharge status: Home / Self Care | Payer: Self-pay | Attending: Emergency Medicine | Admitting: Emergency Medicine

## 2013-11-27 ENCOUNTER — Emergency Department (HOSPITAL_COMMUNITY): Payer: No Typology Code available for payment source

## 2013-11-27 DIAGNOSIS — I498 Other specified cardiac arrhythmias: Secondary | ICD-10-CM | POA: Insufficient documentation

## 2013-11-27 DIAGNOSIS — R52 Pain, unspecified: Secondary | ICD-10-CM | POA: Insufficient documentation

## 2013-11-27 DIAGNOSIS — Z87891 Personal history of nicotine dependence: Secondary | ICD-10-CM | POA: Insufficient documentation

## 2013-11-27 DIAGNOSIS — J209 Acute bronchitis, unspecified: Secondary | ICD-10-CM | POA: Insufficient documentation

## 2013-11-27 DIAGNOSIS — Z8661 Personal history of infections of the central nervous system: Secondary | ICD-10-CM | POA: Insufficient documentation

## 2013-11-27 LAB — I-STAT CHEM 8, ED
BUN: 8 mg/dL (ref 6–23)
CALCIUM ION: 0.99 mmol/L — AB (ref 1.12–1.23)
CREATININE: 0.6 mg/dL (ref 0.50–1.10)
Chloride: 110 mEq/L (ref 96–112)
Glucose, Bld: 104 mg/dL — ABNORMAL HIGH (ref 70–99)
HCT: 42 % (ref 36.0–46.0)
Hemoglobin: 14.3 g/dL (ref 12.0–15.0)
Potassium: 3.7 mEq/L (ref 3.7–5.3)
Sodium: 139 mEq/L (ref 137–147)
TCO2: 19 mmol/L (ref 0–100)

## 2013-11-27 LAB — CBC WITH DIFFERENTIAL/PLATELET
BASOS ABS: 0 10*3/uL (ref 0.0–0.1)
Basophils Relative: 0 % (ref 0–1)
EOS ABS: 0.2 10*3/uL (ref 0.0–0.7)
EOS PCT: 1 % (ref 0–5)
HCT: 38.8 % (ref 36.0–46.0)
Hemoglobin: 13.3 g/dL (ref 12.0–15.0)
LYMPHS ABS: 1.9 10*3/uL (ref 0.7–4.0)
Lymphocytes Relative: 12 % (ref 12–46)
MCH: 32.7 pg (ref 26.0–34.0)
MCHC: 34.3 g/dL (ref 30.0–36.0)
MCV: 95.3 fL (ref 78.0–100.0)
MONO ABS: 1.2 10*3/uL — AB (ref 0.1–1.0)
Monocytes Relative: 8 % (ref 3–12)
Neutro Abs: 11.9 10*3/uL — ABNORMAL HIGH (ref 1.7–7.7)
Neutrophils Relative %: 79 % — ABNORMAL HIGH (ref 43–77)
PLATELETS: 312 10*3/uL (ref 150–400)
RBC: 4.07 MIL/uL (ref 3.87–5.11)
RDW: 14 % (ref 11.5–15.5)
WBC: 15.2 10*3/uL — ABNORMAL HIGH (ref 4.0–10.5)

## 2013-11-27 MED ORDER — PREDNISONE 10 MG PO TABS: 40.0000 mg | ORAL_TABLET | Freq: Every day | ORAL | Status: DC

## 2013-11-27 MED ORDER — HYDROCOD POLST-CHLORPHEN POLST 10-8 MG/5ML PO LQCR: 5.0000 mL | Freq: Once | ORAL | Status: DC

## 2013-11-27 MED ORDER — BENZONATATE 100 MG PO CAPS: 100.0000 mg | ORAL_CAPSULE | Freq: Three times a day (TID) | ORAL | Status: DC

## 2013-11-27 MED ORDER — ALBUTEROL SULFATE (2.5 MG/3ML) 0.083% IN NEBU
5.0000 mg | INHALATION_SOLUTION | Freq: Once | RESPIRATORY_TRACT | Status: AC
  Administered 2013-11-27: 5 mg via RESPIRATORY_TRACT
  Filled 2013-11-27: qty 6

## 2013-11-27 MED ORDER — BENZONATATE 100 MG PO CAPS
100.0000 mg | ORAL_CAPSULE | Freq: Once | ORAL | Status: AC
  Administered 2013-11-27: 100 mg via ORAL
  Filled 2013-11-27: qty 1

## 2013-11-27 MED ORDER — MORPHINE SULFATE 4 MG/ML IJ SOLN
4.0000 mg | Freq: Once | INTRAMUSCULAR | Status: AC
  Administered 2013-11-27: 4 mg via INTRAVENOUS
  Filled 2013-11-27: qty 1

## 2013-11-27 MED ORDER — PREDNISONE 20 MG PO TABS
60.0000 mg | ORAL_TABLET | Freq: Once | ORAL | Status: AC
  Administered 2013-11-27: 60 mg via ORAL
  Filled 2013-11-27: qty 3

## 2013-11-27 MED ORDER — ALBUTEROL (5 MG/ML) CONTINUOUS INHALATION SOLN
15.0000 mg/h | INHALATION_SOLUTION | RESPIRATORY_TRACT | Status: DC
Start: 2013-11-27 — End: 2013-11-27
  Administered 2013-11-27: 15 mg/h via RESPIRATORY_TRACT
  Filled 2013-11-27: qty 20

## 2013-11-27 MED ORDER — ALBUTEROL SULFATE HFA 108 (90 BASE) MCG/ACT IN AERS
2.0000 | INHALATION_SPRAY | RESPIRATORY_TRACT | Status: DC | PRN
  Administered 2013-11-27: 2 via RESPIRATORY_TRACT
  Filled 2013-11-27: qty 6.7

## 2013-11-27 MED ORDER — HYDROCODONE-ACETAMINOPHEN 5-325 MG PO TABS: 1.0000 | ORAL_TABLET | Freq: Four times a day (QID) | ORAL | Status: DC | PRN

## 2013-11-27 MED ORDER — ALBUTEROL SULFATE (2.5 MG/3ML) 0.083% IN NEBU: 15.0000 mg | INHALATION_SOLUTION | Freq: Once | RESPIRATORY_TRACT | Status: DC

## 2013-11-27 MED ORDER — IPRATROPIUM BROMIDE 0.02 % IN SOLN
0.5000 mg | Freq: Once | RESPIRATORY_TRACT | Status: AC
  Administered 2013-11-27: 0.5 mg via RESPIRATORY_TRACT
  Filled 2013-11-27: qty 2.5

## 2013-11-27 MED ORDER — AEROCHAMBER PLUS W/MASK MISC
1.0000 | Freq: Once | Status: AC
  Administered 2013-11-27: 1
  Filled 2013-11-27: qty 1

## 2013-11-27 MED ORDER — IPRATROPIUM BROMIDE 0.02 % IN SOLN
1.0000 mg | Freq: Once | RESPIRATORY_TRACT | Status: AC
  Administered 2013-11-27: 1 mg via RESPIRATORY_TRACT
  Filled 2013-11-27: qty 5

## 2013-11-27 NOTE — ED Provider Notes (Signed)
CSN: [id number]     Arrival date & time 11/27/13  0730 History   First MD Initiated Contact with Patient 11/27/13 [phone]     Chief Complaint  Patient presents with  . Cough  . Generalized Body Aches     (Consider location/radiation/quality/duration/timing/severity/associated sxs/prior Treatment) HPI Complaint of nonproductive cough and shortness of breath and bilateral chest pain worse with coughing onset 3 months ago. She's treated herself multiple over-the-counter medications for the same complaint, without relief. She denies fever. Denies other associated symptoms. Nothing makes symptoms better or worse.  Past Medical History  Diagnosis Date  . Meningitis     2003 and 2006   Past Surgical History  Procedure Laterality Date  . Cholecystectomy     Family History  Problem Relation Age of Onset  . Cancer Other   . Hypertension Other    History  Substance Use Topics  . Smoking status: Former Smoker -- 0.00 packs/day for 13 years  . Smokeless tobacco: Never Used  . Alcohol Use: No   OB History   Grav Para Term Preterm Abortions TAB SAB Ect Mult Living                 Review of Systems  Constitutional: Negative.   Respiratory: Positive for cough, shortness of breath and wheezing.   Cardiovascular: Positive for chest pain.  Gastrointestinal: Negative.   Musculoskeletal: Negative.   Skin: Negative.   Neurological: Positive for headaches.  Psychiatric/Behavioral: Negative.   All other systems reviewed and are negative.     Allergies  Review of patient's allergies indicates no known allergies.  Home Medications   Prior to Admission medications   Medication Sig Start Date End Date Taking? Authorizing Provider  cyclobenzaprine (FLEXERIL) 10 MG tablet Take 1 tablet (10 mg total) by mouth 2 (two) times daily as needed for muscle spasms. 11/13/12   Audree Camel, MD  HYDROcodone-acetaminophen (NORCO) 5-325 MG per tablet Take 1 tablet by mouth every 4 (four) hours as  needed for pain. 11/13/12   Audree Camel, MD  ibuprofen (ADVIL,MOTRIN) 200 MG tablet Take 800 mg by mouth every 8 (eight) hours as needed for pain.     Historical Provider, MD   BP 135/83  Pulse 120  Temp(Src) 98.4 F (36.9 C) (Oral)  Resp 17  SpO2 100% Physical Exam  Nursing note and vitals reviewed. Constitutional: She appears well-developed and well-nourished. She appears distressed.  Appears mildly uncomfortable  HENT:  Head: Normocephalic and atraumatic.  Eyes: Conjunctivae are normal. Pupils are equal, round, and reactive to light.  Neck: Neck supple. No tracheal deviation present. No thyromegaly present.  Cardiovascular: Normal rate and regular rhythm.   No murmur heard. Pulmonary/Chest: Effort normal. She has wheezes.  Coughing frequently. Expiratory wheezes  Abdominal: Soft. Bowel sounds are normal. She exhibits no distension. There is no tenderness.  Musculoskeletal: Normal range of motion. She exhibits no edema and no tenderness.  Neurological: She is alert. No cranial nerve deficit. She exhibits normal muscle tone. Coordination normal.  Glasgow Coma Score 15 results from as well.  Skin: Skin is warm and dry. No rash noted.  Psychiatric: She has a normal mood and affect.    ED Course  Procedures (including critical care time) Labs Review Labs Reviewed  CBC WITH DIFFERENTIAL  I-STAT CHEM 8, ED    Imaging Review No results found.   EKG Interpretation   Date/Time:  Tuesday November 27 2013 07:44:31 EDT Ventricular Rate:  142 PR Interval:  106  QRS Duration: 81 QT Interval:  290 QTC Calculation: 446 R Axis:   94 Text Interpretation:  Sinus tachycardia Ventricular premature complex  Aberrant complex Probable inferior infarct, old SINCE LAST TRACING HEART  RATE HAS INCREASED Confirmed by Ethelda Chick  MD, Yarelis Ambrosino [id number]) on 11/27/2013  8:03:54 AM      Results for orders placed during the hospital encounter of 11/27/13  CBC WITH DIFFERENTIAL      Result Value  Ref Range   WBC 15.2 (*) 4.0 - 10.5 K/uL   RBC 4.07  3.87 - 5.11 MIL/uL   Hemoglobin 13.3  12.0 - 15.0 g/dL   HCT 60.4  54.0 - 98.1 %   MCV 95.3  78.0 - 100.0 fL   MCH 32.7  26.0 - 34.0 pg   MCHC 34.3  30.0 - 36.0 g/dL   RDW 19.1  47.8 - 29.5 %   Platelets 312  150 - 400 K/uL   Neutrophils Relative % 79 (*) 43 - 77 %   Neutro Abs 11.9 (*) 1.7 - 7.7 K/uL   Lymphocytes Relative 12  12 - 46 %   Lymphs Abs 1.9  0.7 - 4.0 K/uL   Monocytes Relative 8  3 - 12 %   Monocytes Absolute 1.2 (*) 0.1 - 1.0 K/uL   Eosinophils Relative 1  0 - 5 %   Eosinophils Absolute 0.2  0.0 - 0.7 K/uL   Basophils Relative 0  0 - 1 %   Basophils Absolute 0.0  0.0 - 0.1 K/uL  I-STAT CHEM 8, ED      Result Value Ref Range   Sodium 139  137 - 147 mEq/L   Potassium 3.7  3.7 - 5.3 mEq/L   Chloride 110  96 - 112 mEq/L   BUN 8  6 - 23 mg/dL   Creatinine, Ser 6.21  0.50 - 1.10 mg/dL   Glucose, Bld 308 (*) 70 - 99 mg/dL   Calcium, Ion 6.57 (*) 1.12 - 1.23 mmol/L   TCO2 19  0 - 100 mmol/L   Hemoglobin 14.3  12.0 - 15.0 g/dL   HCT 84.6  96.2 - 95.2 %   Dg Chest 2 View  11/27/2013   CLINICAL DATA:  COUGH, GENERALIZED BODY ACHES  EXAM: CHEST  2 VIEW  COMPARISON:  May 08, 2010  FINDINGS: The heart size and mediastinal contours are within normal limits. There is no focal infiltrate, pulmonary edema, or pleural effusion. There is small calcified granuloma versus vessel on end in the right upper lobe unchanged. The visualized skeletal structures are unremarkable. Prior cholecystectomy clips are noted.  IMPRESSION: No active cardiopulmonary disease.   Electronically Signed   By: Sherian Rein M.D.   On: 11/27/2013 08:37    8:40 AM Patient continues to cough almost continuously after treatment with albuterol and Atrovent. Morphine, prednisone and continuous nebulization ordered  10:50 AM she feels improved after treatment with continuous nebulization, prednisone and morphine. She is alert Glasgow Coma Score 15. On reexam she  speaks in paragraphs lungs clear auscultation. Coughing occasionally. Chest x-ray viewed by me MDM  Patient maintains a mild resting sinus tachycardia, however she feels much improved. Normal pulse oximetry. She feels ready to go home. She's been treated with continuous nebulization which is likely contributing to tachycardia. Plan albuterol HFA with spacer to go to use 2 puffs every 4 hours when necessary cough or shortness of breath. Prescription Tessalon Perles, Norco prednisone referral resource guide to get primary care physician Counseled patient for  5 minutes on smoking cessation.  Diagnosis #1 acute bronchitis #2 tobacco abuse Final diagnoses:  None        Doug Sou, MD 11/27/13 [phone]

## 2013-11-27 NOTE — ED Notes (Signed)
Opt discharged to home; pain 7/10; verbalized understanding of discharge instructiojns; NAD noted

## 2013-11-27 NOTE — ED Notes (Signed)
Pt states she has been having a cough and wheezing since July. She has been trying OTC medications at home with no relief. Pt has also had increased headaches that began approximately 2 days ago. Pt states it hurts to breathe. Congested cough noted.

## 2013-11-27 NOTE — Discharge Instructions (Signed)
Acute Bronchitis Take Tylenol for mild pain or the pain medicine prescribed for bad pain. Use your inhaler 2 puffs every 4 hours as needed for cough or shortness of breath. Return if needed more than every 4 hours. Call any of the numbers on the resource guide to get a primary care physician. Ask your primary care physician to help you to stop smoking. Bronchitis is inflammation of the airways that extend from the windpipe into the lungs (bronchi). The inflammation often causes mucus to develop. This leads to a cough, which is the most common symptom of bronchitis.  In acute bronchitis, the condition usually develops suddenly and goes away over time, usually in a couple weeks. Smoking, allergies, and asthma can make bronchitis worse. Repeated episodes of bronchitis may cause further lung problems.  CAUSES Acute bronchitis is most often caused by the same virus that causes a cold. The virus can spread from person to person (contagious) through coughing, sneezing, and touching contaminated objects. SIGNS AND SYMPTOMS   Cough.   Fever.   Coughing up mucus.   Body aches.   Chest congestion.   Chills.   Shortness of breath.   Sore throat.  DIAGNOSIS  Acute bronchitis is usually diagnosed through a physical exam. Your health care provider will also ask you questions about your medical history. Tests, such as chest X-rays, are sometimes done to rule out other conditions.  TREATMENT  Acute bronchitis usually goes away in a couple weeks. Oftentimes, no medical treatment is necessary. Medicines are sometimes given for relief of fever or cough. Antibiotic medicines are usually not needed but may be prescribed in certain situations. In some cases, an inhaler may be recommended to help reduce shortness of breath and control the cough. A cool mist vaporizer may also be used to help thin bronchial secretions and make it easier to clear the chest.  HOME CARE INSTRUCTIONS  Get plenty of rest.    Drink enough fluids to keep your urine clear or pale yellow (unless you have a medical condition that requires fluid restriction). Increasing fluids may help thin your respiratory secretions (sputum) and reduce chest congestion, and it will prevent dehydration.   Take medicines only as directed by your health care provider.  If you were prescribed an antibiotic medicine, finish it all even if you start to feel better.  Avoid smoking and secondhand smoke. Exposure to cigarette smoke or irritating chemicals will make bronchitis worse. If you are a smoker, consider using nicotine gum or skin patches to help control withdrawal symptoms. Quitting smoking will help your lungs heal faster.   Reduce the chances of another bout of acute bronchitis by washing your hands frequently, avoiding people with cold symptoms, and trying not to touch your hands to your mouth, nose, or eyes.   Keep all follow-up visits as directed by your health care provider.  SEEK MEDICAL CARE IF: Your symptoms do not improve after 1 week of treatment.  SEEK IMMEDIATE MEDICAL CARE IF:  You develop an increased fever or chills.   You have chest pain.   You have severe shortness of breath.  You have bloody sputum.   You develop dehydration.  You faint or repeatedly feel like you are going to pass out.  You develop repeated vomiting.  You develop a severe headache. MAKE SURE YOU:   Understand these instructions.  Will watch your condition.  Will get help right away if you are not doing well or get worse. Document Released: 04/01/2004 Document Revised:  07/09/2013 Document Reviewed: 08/15/2012 Exit[employer] Patient Information 2015 [place], [place]. This information is not intended to replace advice given to you by your health care provider. Make sure you discuss any questions you have with your health care provider.

## 2013-11-27 NOTE — ED Notes (Signed)
Pt being transported to Radiology at this time

## 2014-10-16 ENCOUNTER — Encounter (HOSPITAL_COMMUNITY): Payer: Self-pay | Admitting: Emergency Medicine

## 2014-10-16 ENCOUNTER — Emergency Department (HOSPITAL_COMMUNITY): Payer: Self-pay

## 2014-10-16 ENCOUNTER — Emergency Department (HOSPITAL_COMMUNITY)
Admission: EM | Admit: 2014-10-16 | Discharge: 2014-10-16 | Disposition: A | Discharge status: Home / Self Care | Payer: Self-pay | Attending: Physician Assistant | Admitting: Physician Assistant

## 2014-10-16 DIAGNOSIS — Z720 Tobacco use: Secondary | ICD-10-CM | POA: Insufficient documentation

## 2014-10-16 DIAGNOSIS — Y998 Other external cause status: Secondary | ICD-10-CM | POA: Insufficient documentation

## 2014-10-16 DIAGNOSIS — S93401A Sprain of unspecified ligament of right ankle, initial encounter: Secondary | ICD-10-CM | POA: Insufficient documentation

## 2014-10-16 DIAGNOSIS — W109XXA Fall (on) (from) unspecified stairs and steps, initial encounter: Secondary | ICD-10-CM | POA: Insufficient documentation

## 2014-10-16 DIAGNOSIS — Z79899 Other long term (current) drug therapy: Secondary | ICD-10-CM | POA: Insufficient documentation

## 2014-10-16 DIAGNOSIS — Z8669 Personal history of other diseases of the nervous system and sense organs: Secondary | ICD-10-CM | POA: Insufficient documentation

## 2014-10-16 DIAGNOSIS — Y9259 Other trade areas as the place of occurrence of the external cause: Secondary | ICD-10-CM | POA: Insufficient documentation

## 2014-10-16 DIAGNOSIS — Y9389 Activity, other specified: Secondary | ICD-10-CM | POA: Insufficient documentation

## 2014-10-16 DIAGNOSIS — Z7952 Long term (current) use of systemic steroids: Secondary | ICD-10-CM | POA: Insufficient documentation

## 2014-10-16 MED ORDER — IBUPROFEN 800 MG PO TABS: 800.0000 mg | ORAL_TABLET | Freq: Three times a day (TID) | ORAL | Status: AC

## 2014-10-16 MED ORDER — OXYCODONE-ACETAMINOPHEN 5-325 MG PO TABS
1.0000 | ORAL_TABLET | Freq: Once | ORAL | Status: AC
  Administered 2014-10-16: 1 via ORAL
  Filled 2014-10-16: qty 1

## 2014-10-16 NOTE — ED Provider Notes (Signed)
CSN: [id number]     Arrival date & time 10/16/14  4259 History   First MD Initiated Contact with Patient 10/16/14 [phone]     Chief Complaint  Patient presents with  . Foot Pain    '     (Consider location/radiation/quality/duration/timing/severity/associated sxs/prior Treatment) HPI   Patient is a 39 year old female presenting today with right ankle pain. Patient reports that 3 days ago she twisted her ankle falling off a curb. She reports the pain is been very bad and gotten worse. She's had minimal swelling. She is able to bear weight. Normal sensation.  Past Medical History  Diagnosis Date  . Meningitis     2003 and 2006   Past Surgical History  Procedure Laterality Date  . Cholecystectomy     Family History  Problem Relation Age of Onset  . Cancer Other   . Hypertension Other    Social History  Substance Use Topics  . Smoking status: Current Every Day Smoker -- 0.00 packs/day for 13 years    Types: Cigarettes  . Smokeless tobacco: Never Used  . Alcohol Use: No   OB History    No data available     Review of Systems  Constitutional: Negative for activity change.  Respiratory: Negative for shortness of breath.   Cardiovascular: Negative for chest pain.  Gastrointestinal: Negative for abdominal pain.      Allergies  Review of patient's allergies indicates no known allergies.  Home Medications   Prior to Admission medications   Medication Sig Start Date End Date Taking? Authorizing Provider  benzonatate (TESSALON) 100 MG capsule Take 1 capsule (100 mg total) by mouth every 8 (eight) hours. 11/27/13   Doug Sou, MD  HYDROcodone-acetaminophen (NORCO) 5-325 MG per tablet Take 1-2 tablets by mouth every 6 (six) hours as needed for severe pain. 11/27/13   Doug Sou, MD  predniSONE (DELTASONE) 10 MG tablet Take 4 tablets (40 mg total) by mouth daily. 11/27/13   Doug Sou, MD   BP 132/92 mmHg  Pulse 99  Temp(Src) 98.2 F (36.8 C) (Oral)  Resp 18   SpO2 100% Physical Exam  Constitutional: She is oriented to person, place, and time. She appears well-developed and well-nourished.  HENT:  Head: Normocephalic and atraumatic.  Eyes: Right eye exhibits no discharge.  Cardiovascular: Normal rate, regular rhythm and normal heart sounds.   No murmur heard. Pulmonary/Chest: Effort normal and breath sounds normal. She has no wheezes. She has no rales.  Musculoskeletal:  Pain to the lateral malleolus of the right ankle. Minimal swelling. Mild decrease in strength secondary to pain. Normal sensation. No external signs of trauma.  Neurological: She is oriented to person, place, and time.  Skin: Skin is warm and dry. She is not diaphoretic.  Psychiatric: She has a normal mood and affect.  Nursing note and vitals reviewed.   ED Course  Procedures (including critical care time) Labs Review Labs Reviewed - No data to display  Imaging Review No results found.   EKG Interpretation None      MDM   Final diagnoses:  None    Patient is a 39 year old female presenting today with right ankle pain after an injury 4 days ago. Patient patient is able to tolerate ambulation. No erythema or joint pain. Likely this is a sprain. We'll get an x-ray. We'll give her 1 Percocet here for comfort. If no fracture, we will send her home with ibuprofen and an Ace bandage.    Hannie Shoe Randall An, MD 10/16/14  0915 

## 2014-10-16 NOTE — ED Notes (Signed)
Pt states that about week ago fell down the stairs in her garage.  Pt c/o right foot pain that is worse with weight bearing and movement.  Pt states that has burning sensation in ankle and inside foot.

## 2014-10-16 NOTE — Discharge Instructions (Signed)
Ankle Sprain °An ankle sprain is an injury to the strong, fibrous tissues (ligaments) that hold the bones of your ankle joint together.  °CAUSES °An ankle sprain is usually caused by a fall or by twisting your ankle. Ankle sprains most commonly occur when you step on the outer edge of your foot, and your ankle turns inward. People who participate in sports are more prone to these types of injuries.  °SYMPTOMS  °· Pain in your ankle. The pain may be present at rest or only when you are trying to stand or walk. °· Swelling. °· Bruising. Bruising may develop immediately or within 1 to 2 days after your injury. °· Difficulty standing or walking, particularly when turning corners or changing directions. °DIAGNOSIS  °Your caregiver will ask you details about your injury and perform a physical exam of your ankle to determine if you have an ankle sprain. During the physical exam, your caregiver will press on and apply pressure to specific areas of your foot and ankle. Your caregiver will try to move your ankle in certain ways. An X-ray exam may be done to be sure a bone was not broken or a ligament did not separate from one of the bones in your ankle (avulsion fracture).  °TREATMENT  °Certain types of braces can help stabilize your ankle. Your caregiver can make a recommendation for this. Your caregiver may recommend the use of medicine for pain. If your sprain is severe, your caregiver may refer you to a surgeon who helps to restore function to parts of your skeletal system (orthopedist) or a physical therapist. °HOME CARE INSTRUCTIONS  °· Apply ice to your injury for 1-2 days or as directed by your caregiver. Applying ice helps to reduce inflammation and pain. °¨ Put ice in a plastic bag. °¨ Place a towel between your skin and the bag. °¨ Leave the ice on for 15-20 minutes at a time, every 2 hours while you are awake. °· Only take over-the-counter or prescription medicines for pain, discomfort, or fever as directed by  your caregiver. °· Elevate your injured ankle above the level of your heart as much as possible for 2-3 days. °· If your caregiver recommends crutches, use them as instructed. Gradually put weight on the affected ankle. Continue to use crutches or a cane until you can walk without feeling pain in your ankle. °· If you have a plaster splint, wear the splint as directed by your caregiver. Do not rest it on anything harder than a pillow for the first 24 hours. Do not put weight on it. Do not get it wet. You may take it off to take a shower or bath. °· You may have been given an elastic bandage to wear around your ankle to provide support. If the elastic bandage is too tight (you have numbness or tingling in your foot or your foot becomes cold and blue), adjust the bandage to make it comfortable. °· If you have an air splint, you may blow more air into it or let air out to make it more comfortable. You may take your splint off at night and before taking a shower or bath. Wiggle your toes in the splint several times per day to decrease swelling. °SEEK MEDICAL CARE IF:  °· You have rapidly increasing bruising or swelling. °· Your toes feel extremely cold or you lose feeling in your foot. °· Your pain is not relieved with medicine. °SEEK IMMEDIATE MEDICAL CARE IF: °· Your toes are numb or blue. °·   You have severe pain that is increasing. °MAKE SURE YOU:  °· Understand these instructions. °· Will watch your condition. °· Will get help right away if you are not doing well or get worse. °Document Released: 02/22/2005 Document Revised: 11/17/2011 Document Reviewed: 03/06/2011 °[employer]® Patient Information ©2015 [employer]. This information is not intended to replace advice given to you by your health care provider. Make sure you discuss any questions you have with your health care provider. ° ° °Ankle Exercises for Rehabilitation °Following ankle injuries, it is as important to follow your caregiver's instructions for  regaining full use of your ankle as it was to follow the initial treatment plan following the injury. The following are some suggestions for exercises and treatment, which can be done to help you regain full use of your ankle as soon as possible. °· Follow all instructions regarding physical therapy. °· Before exercising, it may be helpful to use heat on the muscles or joint being exercised. This loosens up the muscles and tendons (cordlike structure) and decreases chances of injury during your exercises. If this is not possible, just begin your exercises slowly to gradually warm up. °· Stand on your toes several times per day to strengthen the calf muscles. These are the muscles in the back of your leg between the knee and the heel. The cord you can feel just above the heel is the Achilles tendon. Rise up on your toes several times repeating this three to four times per day. Do not exercise to the point of pain. If pain starts to develop, decrease the exercise until you are comfortable again. °· Do range of motion exercises. This means moving the ankle in all directions. Practice writing the alphabet with your toes in the air. Do not increase beyond a range that is comfortable. °· Increase the strength of the muscles in the front of your leg by raising your toes and foot straight up in the air. Repeat this exercise as you did the calf exercise with the same warnings. This also help to stretch your muscles. °· Stretch your calf muscles also by leaning against a wall with your hands in front of you. Put your feet a few feet from the wall and bend your knees until you feel the muscles in your calves become tight. °· After exercising it may be helpful to put ice on the ankle to prevent swelling and improve rehabilitation. This may be done for 15 to 20 minutes following your exercises. If exercising is being done in the workplace, this may not always be possible. °· Taping an ankle injury may be helpful to give added  support following an injury. It also may help prevent reinjury. This may be true if you are in training or in a conditioning program. You and your caregiver can decide on the best course of action to follow. °Document Released: 02/20/2000 Document Revised: 07/09/2013 Document Reviewed: 02/17/2008 °[employer]® Patient Information ©2015 [employer]. This information is not intended to replace advice given to you by your health care provider. Make sure you discuss any questions you have with your health care provider. ° ° °

## 2015-05-18 ENCOUNTER — Encounter (HOSPITAL_COMMUNITY): Payer: Self-pay | Admitting: Emergency Medicine

## 2015-05-18 ENCOUNTER — Emergency Department (HOSPITAL_COMMUNITY): Payer: No Typology Code available for payment source

## 2015-05-18 ENCOUNTER — Emergency Department (HOSPITAL_COMMUNITY)
Admission: EM | Admit: 2015-05-18 | Discharge: 2015-05-18 | Disposition: A | Discharge status: Home / Self Care | Payer: No Typology Code available for payment source | Attending: Physician Assistant | Admitting: Physician Assistant

## 2015-05-18 DIAGNOSIS — IMO0002 Reserved for concepts with insufficient information to code with codable children: Secondary | ICD-10-CM

## 2015-05-18 DIAGNOSIS — Z8669 Personal history of other diseases of the nervous system and sense organs: Secondary | ICD-10-CM | POA: Insufficient documentation

## 2015-05-18 DIAGNOSIS — S61411A Laceration without foreign body of right hand, initial encounter: Secondary | ICD-10-CM | POA: Insufficient documentation

## 2015-05-18 DIAGNOSIS — Y9289 Other specified places as the place of occurrence of the external cause: Secondary | ICD-10-CM | POA: Insufficient documentation

## 2015-05-18 DIAGNOSIS — W25XXXA Contact with sharp glass, initial encounter: Secondary | ICD-10-CM | POA: Insufficient documentation

## 2015-05-18 DIAGNOSIS — Y998 Other external cause status: Secondary | ICD-10-CM | POA: Insufficient documentation

## 2015-05-18 DIAGNOSIS — F17210 Nicotine dependence, cigarettes, uncomplicated: Secondary | ICD-10-CM | POA: Insufficient documentation

## 2015-05-18 DIAGNOSIS — Y9389 Activity, other specified: Secondary | ICD-10-CM | POA: Insufficient documentation

## 2015-05-18 DIAGNOSIS — Z791 Long term (current) use of non-steroidal anti-inflammatories (NSAID): Secondary | ICD-10-CM | POA: Insufficient documentation

## 2015-05-18 MED ORDER — HYDROCODONE-ACETAMINOPHEN 5-325 MG PO TABS: 1.0000 | ORAL_TABLET | Freq: Once | ORAL | Status: DC

## 2015-05-18 MED ORDER — LIDOCAINE HCL 1 % IJ SOLN
INTRAMUSCULAR | Status: AC
  Administered 2015-05-18: 20 mL
  Filled 2015-05-18: qty 20

## 2015-05-18 MED ORDER — HYDROCODONE-ACETAMINOPHEN 5-325 MG PO TABS: 1.0000 | ORAL_TABLET | ORAL | Status: AC | PRN

## 2015-05-18 MED ORDER — LIDOCAINE HCL (PF) 1 % IJ SOLN
5.0000 mL | Freq: Once | INTRAMUSCULAR | Status: AC
  Administered 2015-05-18: 5 mL

## 2015-05-18 NOTE — ED Notes (Signed)
Per patient cut right hand on glass last night-unable to stop bleeding

## 2015-05-18 NOTE — Discharge Instructions (Signed)
1. Medications: usual home medications 2. Treatment: rest, drink plenty of fluids, change dressing daily and keep clean and dry 3. Follow Up: please followup in 7 days for suture removal; if you do not have a primary care doctor use the phone number listed in your discharge paperwork to find one; please return to the ER for increased pain, numbness, redness, discharge, new or worsening symptoms

## 2015-05-18 NOTE — ED Provider Notes (Signed)
CSN: [id number]     Arrival date & time 05/18/15  1148 History  By signing my name below, I, Doreatha Martin, attest that this documentation has been prepared under the direction and in the presence of Mady Gemma, PA-C. Electronically Signed: Doreatha Martin, ED Scribe. 05/18/2015. 1:00 PM.    Chief Complaint  Patient presents with  . Extremity Laceration    The history is provided by the patient. No language interpreter was used.    HPI Comments: Leah Grimes is a 40 y.o. female with no pertinent PMH who presents to the Emergency Department complaining of a laceration with active bleeding to the ulnar aspect of the right hand that occurred at 11PM last night. Patient states that she was compressing the trash in a trash can when a shard of glass cut her. She states associated moderate pain. She reports movement exacerbates her pain. She has not tried anything for symptom relief. Tdap UTD. Pt denies numbness, paresthesia, additional injuries.   Past Medical History  Diagnosis Date  . Meningitis     2003 and 2006   Past Surgical History  Procedure Laterality Date  . Cholecystectomy     Family History  Problem Relation Age of Onset  . Cancer Other   . Hypertension Other    Social History  Substance Use Topics  . Smoking status: Current Every Day Smoker -- 0.00 packs/day for 13 years    Types: Cigarettes  . Smokeless tobacco: Never Used  . Alcohol Use: No   OB History    No data available      Review of Systems  Skin: Positive for wound.  Neurological: Negative for numbness.    Allergies  Review of patient's allergies indicates no known allergies.  Home Medications   Prior to Admission medications   Medication Sig Start Date End Date Taking? Authorizing Provider  ibuprofen (ADVIL,MOTRIN) 200 MG tablet Take 800 mg by mouth every 6 (six) hours as needed for headache, mild pain or moderate pain.    Historical Provider, MD  ibuprofen (ADVIL,MOTRIN) 800 MG tablet  Take 1 tablet (800 mg total) by mouth 3 (three) times daily. 10/16/14   Courteney Lyn Mackuen, MD    BP 140/103 mmHg  Pulse 99  Temp(Src) 98 F (36.7 C) (Oral)  Resp 16  SpO2 100% Physical Exam  Constitutional: She is oriented to person, place, and time. She appears well-developed and well-nourished. No distress.  HENT:  Head: Normocephalic and atraumatic.  Right Ear: External ear normal.  Left Ear: External ear normal.  Nose: Nose normal.  Eyes: Conjunctivae and EOM are normal. Right eye exhibits no discharge. Left eye exhibits no discharge. No scleral icterus.  Neck: Normal range of motion. Neck supple.  Cardiovascular: Normal rate, regular rhythm and intact distal pulses.   Pulmonary/Chest: Effort normal and breath sounds normal. No respiratory distress.  Musculoskeletal: Normal range of motion. She exhibits no edema or tenderness.       Hands: Neurological: She is alert and oriented to person, place, and time. She has normal strength. No sensory deficit.  Skin: Skin is warm and dry. She is not diaphoretic.  1 cm laceration to the ulnar aspect of right palm.  Psychiatric: She has a normal mood and affect. Her behavior is normal.  Nursing note and vitals reviewed.   ED Course  Procedures (including critical care time)  DIAGNOSTIC STUDIES: Oxygen Saturation is 100% on RA, normal by my interpretation.    COORDINATION OF CARE: 12:58 PM Discussed  treatment plan with pt at bedside which includes which includes XR, laceration repair and pt agreed to plan.  Imaging Review Dg Hand Complete Right  05/18/2015  CLINICAL DATA:  40 year old female with history of laceration to the ulnar aspect of the right hand yesterday evening from a shard of glass. EXAM: RIGHT HAND - COMPLETE 3+ VIEW COMPARISON:  No priors. FINDINGS: Multiple views of the right hand demonstrate no acute displaced fracture, subluxation, dislocation, or soft tissue abnormality. No retained radiopaque foreign body in the  soft tissues of the hand. IMPRESSION: No acute radiographic abnormality of the right hand. Electronically Signed   By: Trudie Reedaniel  Entrikin M.D.   On: 05/18/2015 13:27   I have personally reviewed and evaluated these images as part of my medical decision-making.  LACERATION REPAIR PROCEDURE NOTE The patient's identification was confirmed and consent was obtained. This procedure was performed by Glean HessElizabeth Westfall PA-C at 2:04 PM. Site: ulnar aspect of right palm Sterile procedures observed Anesthetic used (type and amt): 4 mL 1% lidocaine without epi Suture type/size: 4-0 prolene Length: 1 cm # of Sutures: 2 Technique: simple interrupted Complexity: simple  Tetanus UTD Site anesthetized, irrigated with NS, explored without evidence of foreign body, wound well approximated, site covered with dry, sterile dressing.  Patient tolerated procedure well without complications. Instructions for care discussed verbally and patient provided with additional written instructions for homecare and f/u.   MDM   Final diagnoses:  Laceration    40 year old female presents with right hand laceration. On exam, patient has full ROM and is NVI. Tetanus UTD. Imaging negative for radiographic abnormality. Laceration clean, repaired, and dressed in the ED. Patient is non-toxic and well-appearing, feel she is stable for discharge at this time. Patient to follow-up for suture removal in 7 days. Return precautions discussed. Patient verbalizes her understanding and is in agreement with plan.  BP 125/83 mmHg  Pulse 90  Temp(Src) 98 F (36.7 C) (Oral)  Resp 16  SpO2 99%   I personally performed the services described in this documentation, which was scribed in my presence. The recorded information has been reviewed and is accurate.    Mady Gemmalizabeth C Westfall, PA-C 05/18/15 1428  Courteney Randall AnLyn Mackuen, MD 05/19/15 1649

## 2015-05-26 ENCOUNTER — Emergency Department (HOSPITAL_COMMUNITY)
Admission: EM | Admit: 2015-05-26 | Discharge: 2015-05-26 | Disposition: A | Discharge status: Home / Self Care | Payer: No Typology Code available for payment source | Attending: Emergency Medicine | Admitting: Emergency Medicine

## 2015-05-26 ENCOUNTER — Encounter (HOSPITAL_COMMUNITY): Payer: Self-pay | Admitting: Emergency Medicine

## 2015-05-26 DIAGNOSIS — Z4802 Encounter for removal of sutures: Secondary | ICD-10-CM

## 2015-05-26 DIAGNOSIS — Z791 Long term (current) use of non-steroidal anti-inflammatories (NSAID): Secondary | ICD-10-CM | POA: Insufficient documentation

## 2015-05-26 DIAGNOSIS — F17210 Nicotine dependence, cigarettes, uncomplicated: Secondary | ICD-10-CM | POA: Insufficient documentation

## 2015-05-26 DIAGNOSIS — Z8661 Personal history of infections of the central nervous system: Secondary | ICD-10-CM | POA: Insufficient documentation

## 2015-05-26 NOTE — ED Provider Notes (Signed)
CSN: [id number]     Arrival date & time 05/26/15  [id number] History   First MD Initiated Contact with Patient 05/26/15 1017     No chief complaint on file.    HPI Pt presents with request for suture removal. Right hand sutures placed on 05/18/15. No complaints. No fever. No drainage. No erythema   Past Medical History  Diagnosis Date  . Meningitis     2003 and 2006   Past Surgical History  Procedure Laterality Date  . Cholecystectomy     Family History  Problem Relation Age of Onset  . Cancer Other   . Hypertension Other    Social History  Substance Use Topics  . Smoking status: Current Every Day Smoker -- 0.00 packs/day for 13 years    Types: Cigarettes  . Smokeless tobacco: Never Used  . Alcohol Use: No   OB History    No data available     Review of Systems  All other systems reviewed and are negative.     Allergies  Review of patient's allergies indicates no known allergies.  Home Medications   Prior to Admission medications   Medication Sig Start Date End Date Taking? Authorizing Provider  HYDROcodone-acetaminophen (NORCO/VICODIN) 5-325 MG tablet Take 1 tablet by mouth every 4 (four) hours as needed. 05/18/15   Mady GemmaElizabeth C Westfall, PA-C  ibuprofen (ADVIL,MOTRIN) 200 MG tablet Take 800 mg by mouth every 6 (six) hours as needed for headache, mild pain or moderate pain.    Historical Provider, MD  ibuprofen (ADVIL,MOTRIN) 800 MG tablet Take 1 tablet (800 mg total) by mouth 3 (three) times daily. 10/16/14   Courteney Lyn Mackuen, MD   There were no vitals taken for this visit. Physical Exam  Constitutional: She is oriented to person, place, and time. She appears well-developed and well-nourished.  HENT:  Head: Normocephalic.  Eyes: EOM are normal.  Neck: Normal range of motion.  Pulmonary/Chest: Effort normal.  Abdominal: She exhibits no distension.  Musculoskeletal: Normal range of motion.  Sutures in ulnar aspect of right hand. No erythema or drainage   Neurological: She is alert and oriented to person, place, and time.  Psychiatric: She has a normal mood and affect.  Nursing note and vitals reviewed.   ED Course  .Suture Removal Performed by: Azalia BilisAMPOS, Leni Pankonin Authorized by: Azalia BilisAMPOS, Isla Sabree Consent: Verbal consent obtained. Risks and benefits: risks, benefits and alternatives were discussed Consent given by: power of attorney and patient Body area: upper extremity Location details: right hand Wound Appearance: clean Sutures Removed: 2 Patient tolerance: Patient tolerated the procedure well with no immediate complications   (including critical care time) Labs Review Labs Reviewed - No data to display  Imaging Review No results found. I have personally reviewed and evaluated these images and lab results as part of my medical decision-making.   EKG Interpretation None      MDM   Final diagnoses:  Visit for suture removal    No signs of infection    Azalia BilisKevin Prajna Vanderpool, MD 05/26/15 1031

## 2015-05-26 NOTE — Discharge Instructions (Signed)

## 2015-05-26 NOTE — ED Notes (Signed)
Pt presents to ED to have stitches removed.

## 2018-04-02 ENCOUNTER — Encounter (HOSPITAL_BASED_OUTPATIENT_CLINIC_OR_DEPARTMENT_OTHER): Payer: Self-pay | Admitting: Emergency Medicine

## 2018-04-02 ENCOUNTER — Other Ambulatory Visit: Payer: Self-pay

## 2018-04-02 ENCOUNTER — Emergency Department (HOSPITAL_BASED_OUTPATIENT_CLINIC_OR_DEPARTMENT_OTHER)
Admission: EM | Admit: 2018-04-02 | Discharge: 2018-04-02 | Disposition: A | Discharge status: Home / Self Care | Payer: 59 | Attending: Emergency Medicine | Admitting: Emergency Medicine

## 2018-04-02 DIAGNOSIS — F17210 Nicotine dependence, cigarettes, uncomplicated: Secondary | ICD-10-CM | POA: Diagnosis not present

## 2018-04-02 DIAGNOSIS — K0889 Other specified disorders of teeth and supporting structures: Secondary | ICD-10-CM | POA: Diagnosis present

## 2018-04-02 MED ORDER — PENICILLIN V POTASSIUM 250 MG PO TABS
500.0000 mg | ORAL_TABLET | Freq: Once | ORAL | Status: AC
  Administered 2018-04-02: 500 mg via ORAL
  Filled 2018-04-02: qty 2

## 2018-04-02 MED ORDER — LIDOCAINE VISCOUS HCL 2 % MT SOLN
15.0000 mL | Freq: Once | OROMUCOSAL | Status: AC
  Administered 2018-04-02: 15 mL via OROMUCOSAL
  Filled 2018-04-02: qty 15

## 2018-04-02 MED ORDER — LIDOCAINE VISCOUS HCL 2 % MT SOLN: OROMUCOSAL | 0 refills | Status: AC

## 2018-04-02 MED ORDER — PENICILLIN V POTASSIUM 500 MG PO TABS: 500.0000 mg | ORAL_TABLET | Freq: Four times a day (QID) | ORAL | 0 refills | Status: AC

## 2018-04-02 MED ORDER — PENICILLIN V POTASSIUM 500 MG PO TABS: 500.0000 mg | ORAL_TABLET | Freq: Four times a day (QID) | ORAL | 0 refills | Status: DC

## 2018-04-02 NOTE — ED Notes (Signed)
ED Provider at bedside. 

## 2018-04-02 NOTE — Discharge Instructions (Signed)
Take the prescribed medication as directed. Follow-up with your dentist as soon as possible. Return to the ED for new or worsening symptoms. 

## 2018-04-02 NOTE — ED Triage Notes (Signed)
Patient states that she has a toothache since last week  - she states that she has been taking amoxicillin for the last week. The patient reports the she has a knot behind the tooth that is swollen

## 2018-04-02 NOTE — ED Notes (Addendum)
Pt c/o upper right front dental pain that worsened today. Pt states that she has been taking for"about a week" amoxicillin that was not prescribed for her. Pt has used orajel with no relief, and denies taking pain medication for pain today.

## 2018-04-02 NOTE — ED Provider Notes (Signed)
[facility] EMERGENCY DEPARTMENT Provider Note   CSN: [id number] Arrival date & time: 04/02/18  2116     History   Chief Complaint Chief Complaint  Patient presents with  . Dental Pain    HPI Leah Grimes is a 43 y.o. female.  The history is provided by the patient and medical records.  Dental Pain     43 y.o. F with hx of meningitis, presenting to the ED for left upper dental pain.  Patient reports tooth broke several weeks/months ago but no issues until this past week.  States left upper canine is broken along back side, starting yesterday she has felt an actual "hole" in the tooth.  States it feels like a "lump" there too and she is concerned.  No fever, chills, difficulty swallowing.  States she doesn't eat much because it hurts to chew.  She did see her dentist, told she needed tooth pulled but doesn't have the money ready yet.  She hopes to follow-up with dentist later this week.  Has tried some OTC meds without relief.  She came in tonight with hopes that someone here could pull her tooth.  Past Medical History:  Diagnosis Date  . Meningitis    2003 and 2006    There are no active problems to display for this patient.   Past Surgical History:  Procedure Laterality Date  . CHOLECYSTECTOMY       OB History   No obstetric history on file.      Home Medications    Prior to Admission medications   Medication Sig Start Date End Date Taking? Authorizing Provider  HYDROcodone-acetaminophen (NORCO/VICODIN) 5-325 MG tablet Take 1 tablet by mouth every 4 (four) hours as needed. 05/18/15   Mady Gemma, PA-C  ibuprofen (ADVIL,MOTRIN) 200 MG tablet Take 800 mg by mouth every 6 (six) hours as needed for headache, mild pain or moderate pain.    [provider]  ibuprofen (ADVIL,MOTRIN) 800 MG tablet Take 1 tablet (800 mg total) by mouth 3 (three) times daily. 10/16/14   Mackuen, Cindee Salt, MD    Family History Family History  Problem  Relation Age of Onset  . Cancer Other   . Hypertension Other     Social History Social History   Tobacco Use  . Smoking status: Current Every Day Smoker    Packs/day: 0.00    Years: 13.00    Pack years: 0.00    Types: Cigarettes  . Smokeless tobacco: Never Used  Substance Use Topics  . Alcohol use: No  . Drug use: No     Allergies   Patient has no known allergies.   Review of Systems Review of Systems  HENT: Positive for dental problem.   All other systems reviewed and are negative.    Physical Exam Updated Vital Signs BP (!) 132/92 (BP Location: Left Arm)   Pulse (!) 107   Temp 98.2 F (36.8 C) (Oral)   Resp 18   Ht 5\' 5"  (1.651 m)   Wt 81.6 kg   SpO2 97%   BMI 29.95 kg/m   Physical Exam Vitals signs and nursing note reviewed.  Constitutional:      Appearance: She is well-developed.     Comments: tearful  HENT:     Head: Normocephalic and atraumatic.     Mouth/Throat:     Comments: Teeth largely in poor dentition, left upper canine broken along the posterior aspect with signs of decay, surrounding gingiva swollen and appears  irritated, handling secretions appropriately, no trismus, no facial or neck swelling, normal phonation without stridor Eyes:     Conjunctiva/sclera: Conjunctivae normal.     Pupils: Pupils are equal, round, and reactive to light.  Neck:     Musculoskeletal: Normal range of motion.  Cardiovascular:     Rate and Rhythm: Normal rate and regular rhythm.     Heart sounds: Normal heart sounds.  Pulmonary:     Effort: Pulmonary effort is normal.     Breath sounds: Normal breath sounds.  Abdominal:     General: Bowel sounds are normal.     Palpations: Abdomen is soft.  Musculoskeletal: Normal range of motion.  Skin:    General: Skin is warm and dry.  Neurological:     Mental Status: She is alert and oriented to person, place, and time.      ED Treatments / Results  Labs (all labs ordered are listed, but only abnormal results  are displayed) Labs Reviewed - No data to display  EKG None  Radiology No results found.  Procedures Procedures (including critical care time)  Medications Ordered in ED Medications  lidocaine (XYLOCAINE) 2 % viscous mouth solution 15 mL (15 mLs Mouth/Throat Given 04/02/18 2239)  penicillin v potassium (VEETID) tablet 500 mg (500 mg Oral Given 04/02/18 2239)     Initial Impression / Assessment and Plan / ED Course  I have reviewed the triage vital signs and the nursing notes.  Pertinent labs & imaging results that were available during my care of the patient were reviewed by me and considered in my medical decision making (see chart for details).  43 year old female presenting to the ED with left upper dental pain.  Tooth broke several weeks/months ago but no issues until last week.  Unable to see dentist until later this week.  Came in today as she was hoping physician here could pull her tooth.  She is afebrile, non-toxic.  Left upper canine is broken along posterior aspect with decay noted.  No definitive abscess formation.  Handling secretions well, normal phonation without stridor.  No signs/symptoms suggestive of ludwig's angina.  Have explained to patient we cannot pull her teeth, but will start antibiotics and symptomatic control.  Encouraged to follow-up closely with her doctor as soon as possible.  She can return here for any new or worsening symptoms.  Final Clinical Impressions(s) / ED Diagnoses   Final diagnoses:  Pain, dental    ED Discharge Orders         Ordered     04/02/18 2259    lidocaine (XYLOCAINE) 2 % solution     04/02/18 2259    penicillin v potassium (VEETID) 500 MG tablet  4 times daily     04/02/18 2300           Garlon Hatchet, PA-C 04/02/18 2328    Tegeler, Canary Brim, MD 04/03/18 [phone]

## 2018-04-02 NOTE — ED Notes (Signed)
Patient left at this time with all belongings. 

## 2022-08-07 ENCOUNTER — Emergency Department (HOSPITAL_COMMUNITY)
Admission: EM | Admit: 2022-08-07 | Discharge: 2022-08-07 | Disposition: A | Discharge status: Home / Self Care | Payer: 59 | Attending: Emergency Medicine | Admitting: Emergency Medicine

## 2022-08-07 ENCOUNTER — Other Ambulatory Visit: Payer: Self-pay

## 2022-08-07 DIAGNOSIS — T402X1A Poisoning by other opioids, accidental (unintentional), initial encounter: Secondary | ICD-10-CM | POA: Insufficient documentation

## 2022-08-07 DIAGNOSIS — T6594XA Toxic effect of unspecified substance, undetermined, initial encounter: Secondary | ICD-10-CM

## 2022-08-07 DIAGNOSIS — D72829 Elevated white blood cell count, unspecified: Secondary | ICD-10-CM | POA: Insufficient documentation

## 2022-08-07 DIAGNOSIS — R112 Nausea with vomiting, unspecified: Secondary | ICD-10-CM | POA: Insufficient documentation

## 2022-08-07 LAB — COMPREHENSIVE METABOLIC PANEL
ALT: 25 U/L (ref 0–44)
AST: 18 U/L (ref 15–41)
Albumin: 3.8 g/dL (ref 3.5–5.0)
Alkaline Phosphatase: 54 U/L (ref 38–126)
Anion gap: 6 (ref 5–15)
BUN: 20 mg/dL (ref 6–20)
CO2: 27 mmol/L (ref 22–32)
Calcium: 8.1 mg/dL — ABNORMAL LOW (ref 8.9–10.3)
Chloride: 109 mmol/L (ref 98–111)
Creatinine, Ser: 0.66 mg/dL (ref 0.44–1.00)
GFR, Estimated: >60 mL/min (ref >60)
Glucose, Bld: 100 mg/dL — ABNORMAL HIGH (ref 70–99)
Potassium: 4.1 mmol/L (ref 3.5–5.1)
Sodium: 142 mmol/L (ref 135–145)
Total Bilirubin: 0.5 mg/dL (ref 0.3–1.2)
Total Protein: 7 g/dL (ref 6.5–8.1)

## 2022-08-07 LAB — CBC
HCT: 37.7 % (ref 36.0–46.0)
Hemoglobin: 12.3 g/dL (ref 12.0–15.0)
MCH: 32.6 pg (ref 26.0–34.0)
MCHC: 32.6 g/dL (ref 30.0–36.0)
MCV: 100 fL (ref 80.0–100.0)
Platelets: 296 10*3/uL (ref 150–400)
RBC: 3.77 MIL/uL — ABNORMAL LOW (ref 3.87–5.11)
RDW: 14.7 % (ref 11.5–15.5)
WBC: 11.4 10*3/uL — ABNORMAL HIGH (ref 4.0–10.5)
nRBC: 0 % (ref 0.0–0.2)

## 2022-08-07 MED ORDER — LORAZEPAM 0.5 MG PO TABS
0.5000 mg | ORAL_TABLET | Freq: Once | ORAL | Status: AC
  Administered 2022-08-07: 0.5 mg via ORAL
  Filled 2022-08-07: qty 1

## 2022-08-07 MED ORDER — SODIUM CHLORIDE 0.9 % IV BOLUS
1000.0000 mL | Freq: Once | INTRAVENOUS | Status: AC
  Administered 2022-08-07: 1000 mL via INTRAVENOUS

## 2022-08-07 MED ORDER — ONDANSETRON HCL 4 MG/2ML IJ SOLN
4.0000 mg | Freq: Once | INTRAMUSCULAR | Status: AC
  Administered 2022-08-07: 4 mg via INTRAVENOUS
  Filled 2022-08-07: qty 2

## 2022-08-07 NOTE — Discharge Instructions (Signed)
Return to the ED with any new or worsening signs or symptoms Please discontinue use of kratom Please follow-up with your PCP for reevaluation Please continue taking all your medications as prescribed

## 2022-08-07 NOTE — ED Triage Notes (Signed)
Pt bib ems from home after taking 5 Kratom capsules. C/o nausea, vomiting and feeling shaky.

## 2022-08-07 NOTE — ED Provider Notes (Signed)
[place] EMERGENCY DEPARTMENT AT [facility] Provider Note   CSN: [id number] Arrival date & time: 08/07/22  1516     History  Chief Complaint  Patient presents with   Ingestion    Leah Grimes is a 47 y.o. female with medical history of meningitis, cholecystectomy.  The patient presents to the ED for evaluation of drug overdose.  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  She states that this brain shunt will cause significant discomfort at times and because of this she has decided to start taking kratom.  Patient reports over the last 2 years she has decided start taking 5-10 kratom capsules per day.  The patient is unaware of how many milligrams are in each capsule.  She states that last night she got off her job at 1 AM and she stopped by a "head shop" and picked up a liquid kratom drink called "MIT 45 super K extra strong".  The patient reports that ever since taking this she has felt extremely jittery, nauseous, she has been throwing up.  She denies any chest pain, shortness of breath, diarrhea, abdominal pain, dysuria, one-sided weakness or numbness.  She denies medications prior to arrival.  She denies SI, HI or other drug ingestion.   Ingestion Pertinent negatives include no chest pain, no abdominal pain and no shortness of breath.       Home Medications Prior to Admission medications   Medication Sig Start Date End Date Taking? Authorizing Provider  HYDROcodone-acetaminophen (NORCO/VICODIN) 5-325 MG tablet Take 1 tablet by mouth every 4 (four) hours as needed. 05/18/15   Mady Gemma, PA-C  ibuprofen (ADVIL,MOTRIN) 200 MG tablet Take 800 mg by mouth every 6 (six) hours as needed for headache, mild pain or moderate pain.    [provider]  ibuprofen (ADVIL,MOTRIN) 800 MG tablet Take 1 tablet (800 mg total) by mouth 3 (three) times daily. 10/16/14   Mackuen, Courteney Lyn, MD  lidocaine (XYLOCAINE) 2  % solution Apply topically every 2-4 hours as needed for dental pain. 04/02/18   Garlon Hatchet, PA-C  penicillin v potassium (VEETID) 500 MG tablet Take 1 tablet (500 mg total) by mouth 4 (four) times daily. 04/02/18   Garlon Hatchet, PA-C      Allergies    Patient has no known allergies.    Review of Systems   Review of Systems  Respiratory:  Negative for shortness of breath.   Cardiovascular:  Negative for chest pain.  Gastrointestinal:  Positive for nausea and vomiting. Negative for abdominal pain.  Psychiatric/Behavioral:  Positive for sleep disturbance. The patient is nervous/anxious.   All other systems reviewed and are negative.   Physical Exam Updated Vital Signs BP (!) 148/100   Pulse 76   Temp 97.9 F (36.6 C)   Resp 16   Ht 5\' 5"  (1.651 m)   SpO2 99%   BMI 29.95 kg/m  Physical Exam Vitals and nursing note reviewed.  Constitutional:      General: She is not in acute distress.    Appearance: She is well-developed.  HENT:     Head: Normocephalic and atraumatic.  Eyes:     Conjunctiva/sclera: Conjunctivae normal.  Cardiovascular:     Rate and Rhythm: Normal rate and regular rhythm.     Heart sounds: No murmur heard. Pulmonary:     Effort: Pulmonary effort is normal. No respiratory distress.     Breath  sounds: Normal breath sounds.  Abdominal:     Palpations: Abdomen is soft.     Tenderness: There is no abdominal tenderness.  Musculoskeletal:        General: No swelling.     Cervical back: Neck supple.  Skin:    General: Skin is warm and dry.     Capillary Refill: Capillary refill takes less than 2 seconds.  Neurological:     Mental Status: She is alert and oriented to person, place, and time.  Psychiatric:        Mood and Affect: Mood normal.     ED Results / Procedures / Treatments   Labs (all labs ordered are listed, but only abnormal results are displayed) Labs Reviewed  CBC - Abnormal; Notable for the following components:      Result Value    WBC 11.4 (*)    RBC 3.77 (*)    All other components within normal limits  COMPREHENSIVE METABOLIC PANEL - Abnormal; Notable for the following components:   Glucose, Bld 100 (*)    Calcium 8.1 (*)    All other components within normal limits    EKG None  Radiology No results found.  Procedures Procedures   Medications Ordered in ED Medications  sodium chloride 0.9 % bolus 1,000 mL (0 mLs Intravenous Stopped 08/07/22 1827)  ondansetron (ZOFRAN) injection 4 mg (4 mg Intravenous Given 08/07/22 1701)  LORazepam (ATIVAN) tablet 0.5 mg (0.5 mg Oral Given 08/07/22 1701)    ED Course/ Medical Decision Making/ A&P Clinical Course as of 08/07/22 2053  Sat Aug 07, 2022  1613 Risk window 12 hours [CG]    Clinical Course User Index [CG] Al Decant, PA-C    Medical Decision Making Amount and/or Complexity of Data Reviewed Labs: ordered.  Risk Prescription drug management.   47 year old female presents to ED for evaluation.  Please see HPI for further details.  On examination the patient is afebrile, nontachycardic.  Her lung sounds are clear bilaterally and she is not hypoxic on room air.  Her abdomen is soft and compressible throughout.  Her neurological examination is without focal deficits.  She is overall nontoxic in appearance.  Patient CBC shows leukocytosis to 11.4, hemoglobin of 12.3.  CMP without electrolyte derangement, no elevated LFT, no elevated creatinine.  EKG nonischemic.  Contacted poison control who advised benzos as needed, fluid and symptomatic care.  The patient was given 1 L fluid, 0.5 mg of Ativan, 4 mg of Zofran for nausea.  Patient was observed for total time of 3 hours.  The patient at the 3-hour mark requesting discharge.  The patient was discharged at this time in stable condition.  She was advised to return to the ED with any new or worsening signs or symptoms and she voiced understanding.  She had all of her questions answered to her  satisfaction.  She is stable to discharge home at this time.   Final Clinical Impression(s) / ED Diagnoses Final diagnoses:  Ingestion of substance, undetermined intent, initial encounter    Rx / DC Orders ED Discharge Orders     None         Clent Ridges 08/07/22 2053    Melene Plan, DO 08/07/22 2243

## 2023-03-15 DIAGNOSIS — Z982 Presence of cerebrospinal fluid drainage device: Secondary | ICD-10-CM | POA: Diagnosis not present

## 2023-03-15 DIAGNOSIS — Z09 Encounter for follow-up examination after completed treatment for conditions other than malignant neoplasm: Secondary | ICD-10-CM | POA: Diagnosis not present

## 2023-03-15 DIAGNOSIS — Z4541 Encounter for adjustment and management of cerebrospinal fluid drainage device: Secondary | ICD-10-CM | POA: Diagnosis not present

## 2023-03-15 DIAGNOSIS — I671 Cerebral aneurysm, nonruptured: Secondary | ICD-10-CM | POA: Diagnosis not present

## 2023-03-15 DIAGNOSIS — Z8679 Personal history of other diseases of the circulatory system: Secondary | ICD-10-CM | POA: Diagnosis not present

## 2023-04-06 ENCOUNTER — Other Ambulatory Visit: Payer: Self-pay

## 2023-04-06 ENCOUNTER — Emergency Department (HOSPITAL_COMMUNITY)
Admission: EM | Admit: 2023-04-06 | Discharge: 2023-04-06 | Disposition: A | Discharge status: Against Medical Advice | Payer: Self-pay | Attending: Emergency Medicine | Admitting: Emergency Medicine

## 2023-04-06 ENCOUNTER — Encounter (HOSPITAL_COMMUNITY): Payer: Self-pay

## 2023-04-06 ENCOUNTER — Emergency Department (HOSPITAL_COMMUNITY): Payer: Self-pay

## 2023-04-06 DIAGNOSIS — Z5321 Procedure and treatment not carried out due to patient leaving prior to being seen by health care provider: Secondary | ICD-10-CM | POA: Insufficient documentation

## 2023-04-06 DIAGNOSIS — M549 Dorsalgia, unspecified: Secondary | ICD-10-CM | POA: Insufficient documentation

## 2023-04-06 DIAGNOSIS — R202 Paresthesia of skin: Secondary | ICD-10-CM | POA: Insufficient documentation

## 2023-04-06 LAB — CBC
HCT: 42.6 % (ref 36.0–46.0)
Hemoglobin: 14.1 g/dL (ref 12.0–15.0)
MCH: 32.8 pg (ref 26.0–34.0)
MCHC: 33.1 g/dL (ref 30.0–36.0)
MCV: 99.1 fL (ref 80.0–100.0)
Platelets: 278 10*3/uL (ref 150–400)
RBC: 4.3 MIL/uL (ref 3.87–5.11)
RDW: 13.9 % (ref 11.5–15.5)
WBC: 10.2 10*3/uL (ref 4.0–10.5)
nRBC: 0 % (ref 0.0–0.2)

## 2023-04-06 LAB — HCG, SERUM, QUALITATIVE: Preg, Serum: NEGATIVE

## 2023-04-06 LAB — BASIC METABOLIC PANEL
Anion gap: 8 (ref 5–15)
BUN: 18 mg/dL (ref 6–20)
CO2: 23 mmol/L (ref 22–32)
Calcium: 8.9 mg/dL (ref 8.9–10.3)
Chloride: 111 mmol/L (ref 98–111)
Creatinine, Ser: 0.48 mg/dL (ref 0.44–1.00)
GFR, Estimated: >60 mL/min (ref >60)
Glucose, Bld: 99 mg/dL (ref 70–99)
Potassium: 3.5 mmol/L (ref 3.5–5.1)
Sodium: 142 mmol/L (ref 135–145)

## 2023-04-06 LAB — TROPONIN I (HIGH SENSITIVITY): Troponin I (High Sensitivity): <2 ng/L (ref <18)

## 2023-04-06 MED ORDER — LIDOCAINE 5 % EX PTCH: 1.0000 | MEDICATED_PATCH | CUTANEOUS | Status: DC

## 2023-04-06 NOTE — ED Triage Notes (Signed)
C/o upper back pain under right shoulder blade radiating into chest and right arm numbness/tingling x1 month.  Ibuprofen, heat/ice, and icy hot w/o relief.

## 2023-04-06 NOTE — ED Notes (Signed)
RN has called pt for repeat trop with no answer , RN will attempt again

## 2023-04-06 NOTE — ED Provider Triage Note (Signed)
Emergency Medicine Provider Triage Evaluation Note  Leah Grimes , a 48 y.o. female  was evaluated in triage.  Pt complains of back pain.  Patient reports upper back pain for the past month that has not improved with OTC analgesics.  She states that the back pain radiates to her chest.  Pain has been persistent and has not worsened.  Review of Systems  Positive: Chest pain, back pain Negative: Shortness of breath  Physical Exam  BP (!) 164/113   Pulse 84   Temp 98 F (36.7 C)   Resp 20   Wt 81 kg   SpO2 100%   BMI 29.72 kg/m  Gen:   Awake, no distress   Resp:  Normal effort  MSK:   Moves extremities without difficulty  Other:  Tenderness to palpation inferior to the right shoulder blade  Medical Decision Making  Medically screening exam initiated at 4:06 PM.  Appropriate orders placed.  ELI ADAMI was informed that the remainder of the evaluation will be completed by another provider, this initial triage assessment does not replace that evaluation, and the importance of remaining in the ED until their evaluation is complete.   Maxwell Marion, PA-C 04/06/23 [phone]

## 2023-11-23 ENCOUNTER — Emergency Department (HOSPITAL_COMMUNITY)
Admission: EM | Admit: 2023-11-23 | Discharge: 2023-11-23 | Disposition: A | Discharge status: Home / Self Care | Payer: Self-pay | Source: Ambulatory Visit | Attending: Emergency Medicine | Admitting: Emergency Medicine

## 2023-11-23 ENCOUNTER — Ambulatory Visit: Payer: Self-pay

## 2023-11-23 ENCOUNTER — Encounter (HOSPITAL_COMMUNITY): Payer: Self-pay

## 2023-11-23 DIAGNOSIS — I10 Essential (primary) hypertension: Secondary | ICD-10-CM | POA: Insufficient documentation

## 2023-11-23 NOTE — Telephone Encounter (Signed)
    Copied from CRM [id number]. Topic: Clinical - Red Word Triage >> Nov 23, 2023  2:17 PM Montie POUR wrote: Red Word that prompted transfer to Nurse Triage:  Last week and today, her blood pressure is reading 100/117 and 100/128. Pulse 112 Reason for Disposition  Systolic BP >= 160 OR Diastolic >= 100    Evaluated at [facility] ED prior to calling in for scheduling.  Answer Assessment - Initial Assessment Questions Additional info:  Patient calling in to establish care. She does not have Pcp at this time. She went to donate plasma today but was deferred due to hypertension needing medical clearance to donate plasma. Patient presented to [facility] ED they recommend further work up for hypertension, she was provided with [employer] [facility] [employer]. [facility] is not accepting new patients at this time, patient is schedule to establish care at [facility]. She shares she currently does not have insurance and would like to know cost of visit and any assistance that may be available, provided with billing phone number. Reviewed reasons to seek emergency evaluation.    1. BLOOD PRESSURE: What is your blood pressure? Did you take at least two measurements 5 minutes apart?     178/100 at [facility] ED today 2. ONSET: When did you take your blood pressure?     Today while donating plasma and in ER 3. HOW: How did you take your blood pressure? (e.g., automatic home BP monitor, visiting nurse)     Er 4. HISTORY: Do you have a history of high blood pressure?     no 5. MEDICINES: Are you taking any medicines for blood pressure? Have you missed any doses recently?      6. OTHER SYMPTOMS: Do you have any symptoms? (e.g., blurred vision, chest pain, difficulty breathing, headache, weakness)     Denies-feel good  Protocols used: Blood Pressure - High-A-AH

## 2023-11-23 NOTE — ED Provider Notes (Signed)
 [place] EMERGENCY DEPARTMENT AT [facility] Provider Note   CSN: [id number] Arrival date & time: 11/23/23  1022     Patient presents with: Hypertension   Leah Grimes is a 48 y.o. female.   Patient went to give plasma today.  And she was told that her blood pressure was high and they could not do the plasma without a note from her primary care doctor.  Patient does not have a primary care doctor.  Chart review shows that her blood pressures in the past have been borderline or elevated.  We have had 140 systolic and with the recent ED visit patient's blood pressure was 160/100.  Patient followed by neurosurgery at [facility] for several brain aneurysms.  Patient has no symptoms currently.  No headache no chest pain no shortness of breath.  She just wanted to give plasma.  Does appear the patient probably does have some degree of hypertension.  And would benefit from workup and starting an antihypertensive med.  Patient does not have time to do that today.  She is get a job interview at 1230.  Says she will return.  Will give her referral to wellness clinic patient is an everyday smoker.       Prior to Admission medications   Medication Sig Start Date End Date Taking? Authorizing Provider  HYDROcodone -acetaminophen  (NORCO/VICODIN) 5-325 MG tablet Take 1 tablet by mouth every 4 (four) hours as needed. 05/18/15   Veronica Almarie BROCKS, PA-C  ibuprofen  (ADVIL ,MOTRIN ) 200 MG tablet Take 800 mg by mouth every 6 (six) hours as needed for headache, mild pain or moderate pain.    [provider]  ibuprofen  (ADVIL ,MOTRIN ) 800 MG tablet Take 1 tablet (800 mg total) by mouth 3 (three) times daily. 10/16/14   Mackuen, Courteney Lyn, MD  lidocaine  (XYLOCAINE ) 2 % solution Apply topically every 2-4 hours as needed for dental pain. 04/02/18   Jarold Olam HERO, PA-C  penicillin  v potassium (VEETID) 500 MG tablet Take 1 tablet (500 mg total) by mouth 4 (four) times daily. 04/02/18    Jarold Olam HERO, PA-C    Allergies: Patient has no known allergies.    Review of Systems  Constitutional:  Negative for chills and fever.  HENT:  Negative for ear pain and sore throat.   Eyes:  Negative for pain and visual disturbance.  Respiratory:  Negative for cough and shortness of breath.   Cardiovascular:  Negative for chest pain and palpitations.  Gastrointestinal:  Negative for abdominal pain and vomiting.  Genitourinary:  Negative for dysuria and hematuria.  Musculoskeletal:  Negative for arthralgias and back pain.  Skin:  Negative for color change and rash.  Neurological:  Negative for seizures and syncope.  All other systems reviewed and are negative.   Updated Vital Signs BP (!) 178/100 (BP Location: Left Arm)   Pulse (!) 118   Temp 98.4 F (36.9 C) (Oral)   Resp 18   SpO2 100%   Physical Exam Vitals and nursing note reviewed.  Constitutional:      General: She is not in acute distress.    Appearance: Normal appearance. She is well-developed.  HENT:     Head: Normocephalic and atraumatic.     Mouth/Throat:     Mouth: Mucous membranes are moist.  Eyes:     Conjunctiva/sclera: Conjunctivae normal.  Cardiovascular:     Rate and Rhythm: Normal rate and regular rhythm.     Heart sounds: No murmur heard. Pulmonary:  Effort: Pulmonary effort is normal. No respiratory distress.     Breath sounds: Normal breath sounds.  Abdominal:     Palpations: Abdomen is soft.     Tenderness: There is no abdominal tenderness.  Musculoskeletal:        General: No swelling.     Cervical back: Neck supple.  Skin:    General: Skin is warm and dry.     Capillary Refill: Capillary refill takes less than 2 seconds.  Neurological:     General: No focal deficit present.     Mental Status: She is alert and oriented to person, place, and time.     Cranial Nerves: No cranial nerve deficit.     Sensory: No sensory deficit.  Psychiatric:        Mood and Affect: Mood normal.      (all labs ordered are listed, but only abnormal results are displayed) Labs Reviewed - No data to display  EKG: None  Radiology: No results found.   Procedures   Medications Ordered in the ED - No data to display                                  Medical Decision Making  Recommended labs and chest x-ray EKG patient does not have time for any of that.  She got a job interview at [employer].  Give her referral to wellness clinic.  For blood pressure check.  But based on her past ED visits I think she is hypertensive and would benefit from started on medication but do not feel comfortable doing that without lab workup.  Was planning on CBC basic metabolic panel chest x-ray EKG.  Patient completely asymptomatic nontoxic no acute distress.  Patient's blood pressure here was 178/100.  However has improved currently in the room to 163/111.  Again suspect that she does have hypertension and does require workup and beginning of antihypertensive meds.  Will discharge with follow-up with wellness clinic.  Patient may come back when she has time for more complete workup.  Final diagnoses:  Primary hypertension    ED Discharge Orders     None          Geraldene Hamilton, MD 11/23/23 1103

## 2023-11-23 NOTE — ED Triage Notes (Signed)
 Pt reports that she went to the plasma center to donate and was told she needs a note from her PCP that she is able to donate because of her HTN. Pt is very tearful in triage saying that she needs that note so she can donate to get some money as she has none.

## 2023-11-23 NOTE — Discharge Instructions (Signed)
 Make an appointment follow-up to wellness clinic.  If you are able return so that we can do some lab workup and chest x-ray to get you started on antihypertensive medication.  If not just follow-up with the wellness clinic.

## 2023-12-19 ENCOUNTER — Other Ambulatory Visit: Payer: Self-pay

## 2023-12-19 ENCOUNTER — Emergency Department (HOSPITAL_COMMUNITY)
Admission: EM | Admit: 2023-12-19 | Discharge: 2023-12-19 | Disposition: A | Discharge status: Home / Self Care | Payer: Self-pay

## 2023-12-19 ENCOUNTER — Encounter (HOSPITAL_COMMUNITY): Payer: Self-pay

## 2023-12-19 DIAGNOSIS — Z79899 Other long term (current) drug therapy: Secondary | ICD-10-CM | POA: Insufficient documentation

## 2023-12-19 DIAGNOSIS — I10 Essential (primary) hypertension: Secondary | ICD-10-CM | POA: Insufficient documentation

## 2023-12-19 HISTORY — DX: Presence of cerebrospinal fluid drainage device: Z98.2

## 2023-12-19 LAB — COMPREHENSIVE METABOLIC PANEL WITH GFR
ALT: 12 U/L (ref 0–44)
AST: 13 U/L — ABNORMAL LOW (ref 15–41)
Albumin: 4.3 g/dL (ref 3.5–5.0)
Alkaline Phosphatase: 62 U/L (ref 38–126)
Anion gap: 9 (ref 5–15)
BUN: 19 mg/dL (ref 6–20)
CO2: 26 mmol/L (ref 22–32)
Calcium: 9.3 mg/dL (ref 8.9–10.3)
Chloride: 108 mmol/L (ref 98–111)
Creatinine, Ser: 0.71 mg/dL (ref 0.44–1.00)
GFR, Estimated: >60 mL/min (ref >60)
Glucose, Bld: 120 mg/dL — ABNORMAL HIGH (ref 70–99)
Potassium: 4.2 mmol/L (ref 3.5–5.1)
Sodium: 143 mmol/L (ref 135–145)
Total Bilirubin: <0.2 mg/dL (ref 0.0–1.2)
Total Protein: 7 g/dL (ref 6.5–8.1)

## 2023-12-19 LAB — URINALYSIS, ROUTINE W REFLEX MICROSCOPIC
Bilirubin Urine: NEGATIVE
Glucose, UA: NEGATIVE mg/dL
Hgb urine dipstick: NEGATIVE
Ketones, ur: NEGATIVE mg/dL
Nitrite: NEGATIVE
Protein, ur: NEGATIVE mg/dL
Specific Gravity, Urine: 1.029 (ref 1.005–1.030)
pH: 6 (ref 5.0–8.0)

## 2023-12-19 LAB — CBC
HCT: 42.2 % (ref 36.0–46.0)
Hemoglobin: 13.3 g/dL (ref 12.0–15.0)
MCH: 31.4 pg (ref 26.0–34.0)
MCHC: 31.5 g/dL (ref 30.0–36.0)
MCV: 99.8 fL (ref 80.0–100.0)
Platelets: 259 K/uL (ref 150–400)
RBC: 4.23 MIL/uL (ref 3.87–5.11)
RDW: 14.4 % (ref 11.5–15.5)
WBC: 10.7 K/uL — ABNORMAL HIGH (ref 4.0–10.5)
nRBC: 0 % (ref 0.0–0.2)

## 2023-12-19 MED ORDER — AMLODIPINE BESYLATE 5 MG PO TABS: 5.0000 mg | ORAL_TABLET | Freq: Every day | ORAL | 2 refills | Status: DC

## 2023-12-19 NOTE — ED Provider Notes (Signed)
 [place] EMERGENCY DEPARTMENT AT [facility] Provider Note   CSN: [id number] Arrival date & time: 12/19/23  1118     Patient presents with: Hypertension   Leah Grimes is a 48 y.o. female.  {Add pertinent medical, surgical, social history, OB history to HPI:32947}  Hypertension Pertinent negatives include no chest pain, no abdominal pain and no shortness of breath.   `      Prior to Admission medications   Medication Sig Start Date End Date Taking? Authorizing Provider  HYDROcodone -acetaminophen  (NORCO/VICODIN) 5-325 MG tablet Take 1 tablet by mouth every 4 (four) hours as needed. 05/18/15   Veronica Almarie BROCKS, PA-C  ibuprofen  (ADVIL ,MOTRIN ) 200 MG tablet Take 800 mg by mouth every 6 (six) hours as needed for headache, mild pain or moderate pain.    [provider]  ibuprofen  (ADVIL ,MOTRIN ) 800 MG tablet Take 1 tablet (800 mg total) by mouth 3 (three) times daily. 10/16/14   Mackuen, Courteney Lyn, MD  lidocaine  (XYLOCAINE ) 2 % solution Apply topically every 2-4 hours as needed for dental pain. 04/02/18   Jarold Olam HERO, PA-C  penicillin  v potassium (VEETID) 500 MG tablet Take 1 tablet (500 mg total) by mouth 4 (four) times daily. 04/02/18   Jarold Olam HERO, PA-C    Allergies: Patient has no known allergies.    Review of Systems  Constitutional:  Negative for chills and fever.  HENT:  Negative for ear pain and sore throat.   Eyes:  Negative for pain and visual disturbance.  Respiratory:  Negative for cough and shortness of breath.   Cardiovascular:  Negative for chest pain and palpitations.  Gastrointestinal:  Negative for abdominal pain and vomiting.  Genitourinary:  Negative for dysuria and hematuria.  Musculoskeletal:  Negative for arthralgias and back pain.  Skin:  Negative for color change and rash.  Neurological:  Negative for seizures and syncope.  All other systems reviewed and are negative.   Updated Vital Signs BP (!) 159/105 (BP  Location: Left Arm)   Pulse 93   Temp 98.1 F (36.7 C) (Oral)   Resp 16   Ht 5' 5 (1.651 m)   Wt 81 kg   SpO2 100%   BMI 29.72 kg/m   Physical Exam Vitals and nursing note reviewed.  Constitutional:      General: She is not in acute distress.    Appearance: She is well-developed.  HENT:     Head: Normocephalic and atraumatic.  Eyes:     Conjunctiva/sclera: Conjunctivae normal.  Cardiovascular:     Rate and Rhythm: Normal rate and regular rhythm.     Heart sounds: No murmur heard. Pulmonary:     Effort: Pulmonary effort is normal. No respiratory distress.     Breath sounds: Normal breath sounds.  Abdominal:     Palpations: Abdomen is soft.     Tenderness: There is no abdominal tenderness.  Musculoskeletal:        General: No swelling.     Cervical back: Neck supple.  Skin:    General: Skin is warm and dry.     Capillary Refill: Capillary refill takes less than 2 seconds.  Neurological:     Mental Status: She is alert.  Psychiatric:        Mood and Affect: Mood normal.     (all labs ordered are listed, but only abnormal results are displayed) Labs Reviewed - No data to display  EKG: None  Radiology: No results found.  {Document cardiac monitor, telemetry  assessment procedure when appropriate:32947} Procedures   Medications Ordered in the ED - No data to display    {Click here for ABCD2, HEART and other calculators REFRESH Note before signing:1}                              Medical Decision Making Amount and/or Complexity of Data Reviewed Labs: ordered.       HPI:    Patient presents because of hypertension.  Patient states that she has never been told that she has hypertension in the past.  Patient states that she has been applying for a job and every time she goes in for the job they take her blood pressure.  Systolic has been routinely above 150-160 every time she goes.  Patient states that she feels asymptomatic.  No chest pain or shortness of  breath.  No headache.  No fever no chills.  No vision changes.  No nausea vomit diarrhea.  Otherwise, feels completely at baseline.  Does not have a PCP.   Previous medical history reviewed : Patient was seen in the ED on November 23, 2023 because of chief complaint of hypertension.   MDM:    On exam, patient hemodynamically stable.  Slightly hypertensive with systolic 159 otherwise no tachypnea or tachycardia.  O2 saturation high percent in room air.  Patient is neurologically intact with NIH of 0.  Cranials 2-12 intact    Chart reviewed.  Patient has been seen because of hypertension in the past.  Last time she was here, recommended basic laboratory workup before starting antihypertensive.  Has been having ongoing blood pressure checks and remains consistently hypertensive.  Does not have a PCP.  States that she cannot afford one.      Interventions: ***  EKG Interpreted by Me: ***   Cardiac Tele Interpreted by Me: ***   I have independently interpreted the CXR *** and CT *** images and agree with the radiologist finding   Social Determinant of Health: ***   Disposition and Follow Up: ***    {Document critical care time when appropriate  Document review of labs and clinical decision tools ie CHADS2VASC2, etc  Document your independent review of radiology images and any outside records  Document your discussion with family members, caretakers and with consultants  Document social determinants of health affecting pt's care  Document your decision making why or why not admission, treatments were needed:32947:::1}   Final diagnoses:  None    ED Discharge Orders     None

## 2023-12-19 NOTE — Discharge Instructions (Addendum)
 Does appear that you have underlying hypertension.  We will start you on medication for this.  Please take this as prescribed.  You will need to write down your blood pressure multiple times per day.  Please take your blood pressure 2 times per day so that way we know how to adjust your medication .   Please follow-up with a primary care physician.  I gave you contact information.  There is no other medical emergency at this time.

## 2023-12-19 NOTE — ED Triage Notes (Addendum)
 Patient said her blood pressure is reading elevated and wants to make sure she is ok to go back to her work. She wants to donate plasma and see if she is allowed. Denies pain or headache.

## 2023-12-20 ENCOUNTER — Telehealth (HOSPITAL_COMMUNITY): Payer: Self-pay | Admitting: Emergency Medicine

## 2023-12-20 MED ORDER — AMLODIPINE BESYLATE 5 MG PO TABS: 5.0000 mg | ORAL_TABLET | Freq: Every day | ORAL | 2 refills | Status: AC

## 2023-12-20 NOTE — Telephone Encounter (Cosign Needed)
 Patient called to have prescription sent to [facility] pharmacy. Sent prescription for amlodipine 5 mg as originally prescribed by Dr. Jackquline to appropriate pharmacy.

## 2023-12-20 NOTE — Telephone Encounter (Deleted)
 Patient called to have prescription sent to [facility] pharmacy. Sent prescription for amlodipine 5 mg as originally prescribed by Dr. Jackquline to appropriate pharmacy.

## 2023-12-26 ENCOUNTER — Ambulatory Visit: Payer: Self-pay
